# Patient Record
Sex: Male | Born: 1985 | Race: White | Hispanic: No | Marital: Single | Smoking: Never smoker
Health system: Southern US, Community
[De-identification: ages and names within clinical notes are randomized; demographics above are authoritative.]

## PROBLEM LIST (undated history)

## (undated) DIAGNOSIS — C8590 Non-Hodgkin lymphoma, unspecified, unspecified site: Secondary | ICD-10-CM

## (undated) DIAGNOSIS — R569 Unspecified convulsions: Secondary | ICD-10-CM

## (undated) HISTORY — PX: WISDOM TOOTH EXTRACTION: SHX21

---
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
## 2017-03-02 ENCOUNTER — Emergency Department: Payer: Managed Care, Other (non HMO)

## 2017-03-02 ENCOUNTER — Encounter: Payer: Self-pay | Admitting: Emergency Medicine

## 2017-03-02 ENCOUNTER — Emergency Department
Admission: EM | Admit: 2017-03-02 | Discharge: 2017-03-02 | Disposition: A | Discharge status: Home / Self Care | Payer: Managed Care, Other (non HMO) | Attending: Emergency Medicine | Admitting: Emergency Medicine

## 2017-03-02 ENCOUNTER — Other Ambulatory Visit: Payer: Self-pay

## 2017-03-02 DIAGNOSIS — R569 Unspecified convulsions: Secondary | ICD-10-CM | POA: Diagnosis not present

## 2017-03-02 HISTORY — DX: Non-Hodgkin lymphoma, unspecified, unspecified site: C85.90

## 2017-03-02 HISTORY — DX: Unspecified convulsions: R56.9

## 2017-03-02 LAB — COMPREHENSIVE METABOLIC PANEL
ALBUMIN: 3.9 g/dL (ref 3.5–5.0)
ALK PHOS: 49 U/L (ref 38–126)
ALT: 18 U/L (ref 17–63)
AST: 33 U/L (ref 15–41)
Anion gap: 12 (ref 5–15)
BILIRUBIN TOTAL: 0.8 mg/dL (ref 0.3–1.2)
BUN: 18 mg/dL (ref 6–20)
CO2: 20 mmol/L — ABNORMAL LOW (ref 22–32)
CREATININE: 1.37 mg/dL — AB (ref 0.61–1.24)
Calcium: 9 mg/dL (ref 8.9–10.3)
Chloride: 107 mmol/L (ref 101–111)
GFR calc Af Amer: >60 mL/min (ref >60)
GLUCOSE: 143 mg/dL — AB (ref 65–99)
POTASSIUM: 3.8 mmol/L (ref 3.5–5.1)
Sodium: 139 mmol/L (ref 135–145)
TOTAL PROTEIN: 7.3 g/dL (ref 6.5–8.1)

## 2017-03-02 LAB — CBC WITH DIFFERENTIAL/PLATELET
BASOS ABS: 0.1 10*3/uL (ref 0–0.1)
BASOS PCT: 1 %
Eosinophils Absolute: 0.2 10*3/uL (ref 0–0.7)
Eosinophils Relative: 3 %
HEMATOCRIT: 44.7 % (ref 40.0–52.0)
HEMOGLOBIN: 15 g/dL (ref 13.0–18.0)
LYMPHS PCT: 34 %
Lymphs Abs: 2.1 10*3/uL (ref 1.0–3.6)
MCH: 30.8 pg (ref 26.0–34.0)
MCHC: 33.5 g/dL (ref 32.0–36.0)
MCV: 92 fL (ref 80.0–100.0)
MONO ABS: 0.6 10*3/uL (ref 0.2–1.0)
Monocytes Relative: 10 %
NEUTROS ABS: 3.3 10*3/uL (ref 1.4–6.5)
NEUTROS PCT: 52 %
Platelets: 259 10*3/uL (ref 150–440)
RBC: 4.86 MIL/uL (ref 4.40–5.90)
RDW: 12.5 % (ref 11.5–14.5)
WBC: 6.3 10*3/uL (ref 3.8–10.6)

## 2017-03-02 MED ORDER — LEVETIRACETAM 500 MG PO TABS: 500.0000 mg | ORAL_TABLET | Freq: Two times a day (BID) | ORAL | 0 refills | Status: DC

## 2017-03-02 MED ORDER — SODIUM CHLORIDE 0.9 % IV BOLUS (SEPSIS)
1000.0000 mL | Freq: Once | INTRAVENOUS | Status: AC
  Administered 2017-03-02: 1000 mL via INTRAVENOUS

## 2017-03-02 MED ORDER — SODIUM CHLORIDE 0.9 % IV SOLN
1000.0000 mg | Freq: Once | INTRAVENOUS | Status: AC
  Administered 2017-03-02: 1000 mg via INTRAVENOUS
  Filled 2017-03-02: qty 10

## 2017-03-02 NOTE — ED Provider Notes (Signed)
[facility] Emergency Department Provider Note  ____________________________________________   None    (approximate)  I have reviewed the triage vital signs and the nursing notes.   HISTORY  Chief Complaint Seizures   HPI Carl Walter is a 32 y.o. male with a history of seizures x2 as well as non-Hodgkin's lymphoma who is presenting to the emergency department today after seizure.  The seizure was witnessed by bystanders.  Per EMS, there was generalized tonic-clonic activity that lasted about 5 minutes.  Patient says that he could "feel the seizure coming on."  He says that he felt slightly lightheaded prior to procedure beginning.  EMS was called.  Patient was confused/postictal but denies any complaints at this time.  He says that he did not sleep well last night but otherwise he does not drink excessively.  Says that he drinks about once a week.  Does not use drugs.  No seizure history in his family.  Says that he thinks he has had a CAT scan before with his previous seizures but is unsure about this.  Has not come to this hospital for her medical issues.  Says that he was not started on any seizure medications previously because he said the doctors "did not find any signs that he would have an additional seizure."  Per EMS, the patient was lowered to the ground by bystanders and did not fall or hit his head.   Past Medical History:  Diagnosis Date  . Non Hodgkin's lymphoma ([place]) 1998  . Seizures ([place])     There are no active problems to display for this patient.   Past Surgical History:  Procedure Laterality Date  . EYE SURGERY Left 1998  . WISDOM TOOTH EXTRACTION      Prior to Admission medications   Not on File    Allergies Patient has no known allergies.  History reviewed. No pertinent family history.  Social History Social History   Tobacco Use  . Smoking status: Never Smoker  . Smokeless tobacco: Never Used  Substance Use Topics  .  Alcohol use: Yes    Comment: once/week  . Drug use: No    Review of Systems  Constitutional: No fever/chills Eyes: No visual changes. ENT: No sore throat. Cardiovascular: Denies chest pain. Respiratory: Denies shortness of breath. Gastrointestinal: No abdominal pain.  No nausea, no vomiting.  No diarrhea.  No constipation. Genitourinary: Negative for dysuria. Musculoskeletal: Negative for back pain. Skin: Negative for rash. Neurological: Negative for headaches, focal weakness or numbness.   ____________________________________________   PHYSICAL EXAM:  VITAL SIGNS: ED Triage Vitals  Enc Vitals Group     BP 03/02/17 0924 121/75     Pulse Rate 03/02/17 0924 (!) 113     Resp 03/02/17 0924 16     Temp 03/02/17 0924 98.2 F (36.8 C)     Temp Source 03/02/17 0924 Oral     SpO2 03/02/17 0924 91 %     Weight 03/02/17 0927 165 lb (74.8 kg)     Height 03/02/17 0927 5\' 10"  (1.778 m)     Head Circumference --      Peak Flow --      Pain Score --      Pain Loc --      Pain Edu? --      Excl. in [place]? --     Constitutional: Alert and oriented. Well appearing and in no acute distress. Eyes: Conjunctivae are normal.  Head: 2 x 4 cm  hematoma over the right superior aspect of the forehead.  No bogginess.  Minimal tenderness to palpation. Nose: No congestion/rhinnorhea. Mouth/Throat: Mucous membranes are moist.  No tongue bite visualized. Neck: No stridor.   Cardiovascular: Tachycardic, regular rhythm. Grossly normal heart sounds.   Respiratory: Normal respiratory effort.  No retractions. Lungs CTAB. Gastrointestinal: Soft and nontender. No distention. Musculoskeletal: No lower extremity tenderness nor edema.  No joint effusions. Neurologic:  Normal speech and language. No gross focal neurologic deficits are appreciated. Skin:  Skin is warm, dry and intact. No rash noted. Psychiatric: Mood and affect are normal. Speech and behavior are  normal.  ____________________________________________   LABS (all labs ordered are listed, but only abnormal results are displayed)  Labs Reviewed  COMPREHENSIVE METABOLIC PANEL - Abnormal; Notable for the following components:      Result Value   CO2 20 (*)    Glucose, Bld 143 (*)    Creatinine, Ser 1.37 (*)    All other components within normal limits  CBC WITH DIFFERENTIAL/PLATELET   ____________________________________________  EKG  ED ECG REPORT I, Doran Stabler, the attending physician, personally viewed and interpreted this ECG.   Date: 03/02/2017  EKG Time: 0925  Rate: 114  Rhythm: sinus tachycardia  Axis: Normal  Intervals:none  ST&T Change: No ST segment elevation or depression.  No abnormal T wave inversion.  ____________________________________________  RADIOLOGY  Scalp swelling but without any other pathology on the CAT scan. ____________________________________________   PROCEDURES  Procedure(s) performed:   Procedures  Critical Care performed:   ____________________________________________   INITIAL IMPRESSION / ASSESSMENT AND PLAN / ED COURSE  Pertinent labs & imaging results that were available during my care of the patient were reviewed by me and considered in my medical decision making (see chart for details).  DDX: Electrolyte abnormality, brain hemorrhage, intracranial mass epilepsy, fatigue No previous records on file for review.  Patient states that he still drives.  Patient knows no driving is further cleared by neurology.  He is understanding of this plan.    ----------------------------------------- 10:13 AM on 03/02/2017 -----------------------------------------  Patient vomited x1 but denies any nausea or vomiting.  Heart rate of 90 bpm at this time.  Blood pressure 121/61.  We discussed starting Keppra as well as need to follow-up with neurology.  Also understands that he must not drive until cleared by neurologist to  resume.  This is the patient's third seizure.  I believe that he will need to be started on medication at this time and he will be discharged with twice daily Keppra 500 mg.  We also discussed the need for further workup with neurology including EEG as well as MRI.  The patient is understanding of this plan willing to comply.  Although the patient became lightheaded prior to the event happening, it is less likely that this was a syncopal episode as a generalized tonic-clonic activity was observed for 5 minutes.  Patient also confused afterwards likely representing postictal state.  However, he is fully awake and alert and oriented at this time and without any complaints.  ____________________________________________   FINAL CLINICAL IMPRESSION(S) / ED DIAGNOSES  Seizure.    NEW MEDICATIONS STARTED DURING THIS VISIT:  New Prescriptions   No medications on file     Note:  This document was prepared using Dragon voice recognition software and may include unintentional dictation errors.     Orbie Pyo, MD 03/02/17 [phone]

## 2017-03-02 NOTE — ED Notes (Signed)
Pt in NAD at time of d/c, VS stable. Pt ambulatory. Pt verbalizes d/c understanding and follow up

## 2017-03-02 NOTE — ED Triage Notes (Signed)
Pt to ED via EMS from work c/o grand mal seizure today, was helped to floor when coworkers noticed patient starting to shake.  Lasted 5-10 minutes, post-ictal for EMS.  Last seizure 1 year ago, denies medications for seizures.  EMS vitals CBG 150, 98.7 temp  Pt presents A&Ox4, speaking in complete and coherent sentences, pale and diaphoretic, chest rise even and unlabored.

## 2018-10-22 ENCOUNTER — Emergency Department: Payer: Managed Care, Other (non HMO)

## 2018-10-22 ENCOUNTER — Emergency Department
Admission: EM | Admit: 2018-10-22 | Discharge: 2018-10-22 | Disposition: A | Discharge status: Home / Self Care | Payer: Managed Care, Other (non HMO) | Attending: Emergency Medicine | Admitting: Emergency Medicine

## 2018-10-22 ENCOUNTER — Encounter: Payer: Self-pay | Admitting: Emergency Medicine

## 2018-10-22 DIAGNOSIS — Y990 Civilian activity done for income or pay: Secondary | ICD-10-CM | POA: Insufficient documentation

## 2018-10-22 DIAGNOSIS — R55 Syncope and collapse: Secondary | ICD-10-CM | POA: Insufficient documentation

## 2018-10-22 DIAGNOSIS — Y939 Activity, unspecified: Secondary | ICD-10-CM | POA: Diagnosis not present

## 2018-10-22 DIAGNOSIS — S43006A Unspecified dislocation of unspecified shoulder joint, initial encounter: Secondary | ICD-10-CM

## 2018-10-22 DIAGNOSIS — S43014A Anterior dislocation of right humerus, initial encounter: Secondary | ICD-10-CM | POA: Insufficient documentation

## 2018-10-22 DIAGNOSIS — W010XXA Fall on same level from slipping, tripping and stumbling without subsequent striking against object, initial encounter: Secondary | ICD-10-CM | POA: Diagnosis not present

## 2018-10-22 DIAGNOSIS — Y9259 Other trade areas as the place of occurrence of the external cause: Secondary | ICD-10-CM | POA: Diagnosis not present

## 2018-10-22 DIAGNOSIS — S4991XA Unspecified injury of right shoulder and upper arm, initial encounter: Secondary | ICD-10-CM | POA: Diagnosis present

## 2018-10-22 LAB — CBC WITH DIFFERENTIAL/PLATELET
Abs Immature Granulocytes: 0.05 10*3/uL (ref 0.00–0.07)
Basophils Absolute: 0.1 10*3/uL (ref 0.0–0.1)
Basophils Relative: 1 %
Eosinophils Absolute: 0.1 10*3/uL (ref 0.0–0.5)
Eosinophils Relative: 2 %
HCT: 44.9 % (ref 39.0–52.0)
Hemoglobin: 14.8 g/dL (ref 13.0–17.0)
Immature Granulocytes: 1 %
Lymphocytes Relative: 34 %
Lymphs Abs: 2.3 10*3/uL (ref 0.7–4.0)
MCH: 30.7 pg (ref 26.0–34.0)
MCHC: 33 g/dL (ref 30.0–36.0)
MCV: 93.2 fL (ref 80.0–100.0)
Monocytes Absolute: 0.5 10*3/uL (ref 0.1–1.0)
Monocytes Relative: 7 %
Neutro Abs: 3.8 10*3/uL (ref 1.7–7.7)
Neutrophils Relative %: 55 %
Platelets: 275 10*3/uL (ref 150–400)
RBC: 4.82 MIL/uL (ref 4.22–5.81)
RDW: 12.4 % (ref 11.5–15.5)
WBC: 6.7 10*3/uL (ref 4.0–10.5)
nRBC: 0 % (ref 0.0–0.2)

## 2018-10-22 LAB — BASIC METABOLIC PANEL
Anion gap: 16 — ABNORMAL HIGH (ref 5–15)
BUN: 17 mg/dL (ref 6–20)
CO2: 18 mmol/L — ABNORMAL LOW (ref 22–32)
Calcium: 9.1 mg/dL (ref 8.9–10.3)
Chloride: 107 mmol/L (ref 98–111)
Creatinine, Ser: 1.42 mg/dL — ABNORMAL HIGH (ref 0.61–1.24)
GFR calc Af Amer: >60 mL/min (ref >60)
GFR calc non Af Amer: >60 mL/min (ref >60)
Glucose, Bld: 165 mg/dL — ABNORMAL HIGH (ref 70–99)
Potassium: 3.9 mmol/L (ref 3.5–5.1)
Sodium: 141 mmol/L (ref 135–145)

## 2018-10-22 MED ORDER — MIDAZOLAM HCL 2 MG/2ML IJ SOLN
4.0000 mg | Freq: Once | INTRAMUSCULAR | Status: AC
  Administered 2018-10-22: 13:00:00 4 mg via INTRAVENOUS
  Filled 2018-10-22: qty 4

## 2018-10-22 MED ORDER — PROPOFOL 10 MG/ML IV BOLUS
INTRAVENOUS | Status: AC | PRN
  Administered 2018-10-22 (×3): 8 mg via INTRAVENOUS
  Administered 2018-10-22: 4 mg via INTRAVENOUS

## 2018-10-22 MED ORDER — ETOMIDATE 2 MG/ML IV SOLN
INTRAVENOUS | Status: AC | PRN
  Administered 2018-10-22 (×2): 10 mg via INTRAVENOUS

## 2018-10-22 MED ORDER — PROPOFOL 10 MG/ML IV BOLUS
1.0000 mg/kg | Freq: Once | INTRAVENOUS | Status: DC
  Filled 2018-10-22: qty 20

## 2018-10-22 MED ORDER — ETOMIDATE 2 MG/ML IV SOLN
20.0000 mg | Freq: Once | INTRAVENOUS | Status: DC
  Filled 2018-10-22: qty 10

## 2018-10-22 MED ORDER — HYDROMORPHONE HCL 1 MG/ML IJ SOLN
1.0000 mg | Freq: Once | INTRAMUSCULAR | Status: AC
  Administered 2018-10-22: 10:00:00 1 mg via INTRAVENOUS
  Filled 2018-10-22: qty 1

## 2018-10-22 MED ORDER — SODIUM CHLORIDE 0.9 % IV BOLUS
1000.0000 mL | Freq: Once | INTRAVENOUS | Status: AC
  Administered 2018-10-22: 10:00:00 1000 mL via INTRAVENOUS

## 2018-10-22 MED ORDER — KETAMINE HCL 10 MG/ML IJ SOLN
1.0000 mg/kg | Freq: Once | INTRAMUSCULAR | Status: DC
  Filled 2018-10-22: qty 1

## 2018-10-22 MED ORDER — SODIUM CHLORIDE 0.9 % IV BOLUS
1000.0000 mL | Freq: Once | INTRAVENOUS | Status: AC
  Administered 2018-10-22: 14:00:00 1000 mL via INTRAVENOUS

## 2018-10-22 MED ORDER — LEVETIRACETAM IN NACL 1000 MG/100ML IV SOLN
1000.0000 mg | Freq: Once | INTRAVENOUS | Status: AC
  Administered 2018-10-22: 11:00:00 1000 mg via INTRAVENOUS
  Filled 2018-10-22: qty 100

## 2018-10-22 MED ORDER — ONDANSETRON HCL 4 MG/2ML IJ SOLN
4.0000 mg | Freq: Once | INTRAMUSCULAR | Status: AC
  Administered 2018-10-22: 12:00:00 4 mg via INTRAVENOUS
  Filled 2018-10-22: qty 2

## 2018-10-22 MED ORDER — PROPOFOL 10 MG/ML IV BOLUS
INTRAVENOUS | Status: AC | PRN
  Administered 2018-10-22: 8 mg via INTRAVENOUS

## 2018-10-22 MED ORDER — KETOROLAC TROMETHAMINE 10 MG PO TABS: 10.0000 mg | ORAL_TABLET | Freq: Four times a day (QID) | ORAL | 0 refills | Status: DC | PRN

## 2018-10-22 MED ORDER — PROPOFOL 10 MG/ML IV BOLUS
INTRAVENOUS | Status: AC
  Filled 2018-10-22: qty 20

## 2018-10-22 MED ORDER — LEVETIRACETAM 500 MG PO TABS: 500.0000 mg | ORAL_TABLET | Freq: Two times a day (BID) | ORAL | 0 refills | Status: DC

## 2018-10-22 NOTE — ED Notes (Signed)
Pt verbalized understanding of discharge instructions. Pt A&O and took PO challenge without difficulty. Pt wheeled to car by this RN and pt is NAD at this time.

## 2018-10-22 NOTE — Discharge Instructions (Addendum)
Keep the shoulder immobilizer in place at all times except bathing for the next week.  When you take the immobilizer off, keep the right arm hanging by your side. Do not lift your right arm, especially up to chest level or higher, until seen by orthopedics.

## 2018-10-22 NOTE — ED Notes (Signed)
Pt resting comfortably with mother at bedside. Pt's vitals WDL and currently on 2L O2 for comfort. Will continue to monitor.

## 2018-10-22 NOTE — Consult Note (Signed)
ORTHOPAEDIC CONSULTATION  PATIENT NAME: Carl Walter DOB: 12/09/1985  MRN: [id number]  REQUESTING PHYSICIAN: Carrie Mew, MD  Chief Complaint: Right shoulder anterior dislocation  HPI: Carl Walter is a 33 y.o. male who complains of right shoulder pain after a syncopal episode and fall.  He was brought to the ER at [facility] where x-rays showed evidence of a right shoulder anterior dislocation.  Patient has prior history of another dislocation a year ago that was treated with closed reduction.  Patient works at [employer].  He denies pain anywhere else in his body.  Past Medical History:  Diagnosis Date  . Non Hodgkin's lymphoma ([place]) 1998  . Seizures ([place])    Past Surgical History:  Procedure Laterality Date  . EYE SURGERY Left 1998  . WISDOM TOOTH EXTRACTION     Social History   Socioeconomic History  . Marital status: Single    Spouse name: Not on file  . Number of children: Not on file  . Years of education: Not on file  . Highest education level: Not on file  Occupational History  . Not on file  Social Needs  . Financial resource strain: Not on file  . Food insecurity    Worry: Not on file    Inability: Not on file  . Transportation needs    Medical: Not on file    Non-medical: Not on file  Tobacco Use  . Smoking status: Never Smoker  . Smokeless tobacco: Never Used  Substance and Sexual Activity  . Alcohol use: Yes    Comment: once/week  . Drug use: No  . Sexual activity: Not on file  Lifestyle  . Physical activity    Days per week: Not on file    Minutes per session: Not on file  . Stress: Not on file  Relationships  . Social Herbalist on phone: Not on file    Gets together: Not on file    Attends religious service: Not on file    Active member of club or organization: Not on file    Attends meetings of clubs or organizations: Not on file    Relationship status: Not on file  Other Topics Concern  . Not on file   Social History Narrative  . Not on file   History reviewed. No pertinent family history. No Known Allergies Prior to Admission medications   Medication Sig Start Date End Date Taking? Authorizing Provider  montelukast (SINGULAIR) 10 MG tablet Take 10 mg by mouth daily as needed (allergy symptom).   Yes [provider]  levETIRAcetam (KEPPRA) 500 MG tablet Take 1 tablet (500 mg total) by mouth 2 (two) times daily. Patient not taking: Reported on 10/22/2018 03/02/17   Orbie Pyo, MD   Dg Shoulder Right  Result Date: 10/22/2018 CLINICAL DATA:  Attempted reduction EXAM: RIGHT SHOULDER - 2+ VIEW COMPARISON:  10/22/2018, 10:39 a.m. FINDINGS: The right glenohumeral joint remains anteriorly dislocated. No obvious fracture. The right acromioclavicular joint is preserved. The partially imaged right chest is unremarkable. IMPRESSION: The right glenohumeral joint remains anteriorly dislocated. No obvious fracture. Electronically Signed   By: Eddie Candle M.D.   On: 10/22/2018 12:52   Dg Shoulder Right  Result Date: 10/22/2018 CLINICAL DATA:  Right shoulder pain EXAM: RIGHT SHOULDER - 2+ VIEW COMPARISON:  None. FINDINGS: There is anterior dislocation of the right glenohumeral joint. No obvious humeral or bony glenoid fracture. The right acromioclavicular joint is preserved. The partially imaged right chest  is unremarkable. IMPRESSION: There is anterior dislocation of the right glenohumeral joint. No obvious humeral or bony glenoid fracture. Electronically Signed   By: Eddie Candle M.D.   On: 10/22/2018 11:22    Positive ROS: All other systems have been reviewed and were otherwise negative with the exception of those mentioned in the HPI and as above.  Physical Exam: General: Well developed, well nourished male seen in no acute distress. HEENT: Atraumatic and normocephalic. Sclera are clear. Extraocular motion is intact. Oropharynx is clear with moist mucosa. Neck: Supple,  nontender, good range of motion. No JVD or carotid bruits. Lungs: Clear to auscultation bilaterally. Cardiovascular: Regular rate and rhythm with normal S1 and S2. No murmurs. No gallops or rubs. Pedal pulses are palpable bilaterally. Homans test is negative bilaterally. No significant pretibial or ankle edema. Abdomen: Soft, nontender, and nondistended. Bowel sounds are present. Skin: No lesions in the area of chief complaint Neurologic: Awake, alert, and oriented. Sensory function is grossly intact. Motor strength is felt to be 5 over 5 bilaterally. No clonus or tremor. Good motor coordination. Lymphatic: No axillary or cervical lymphadenopathy  MUSCULOSKELETAL: Right upper extremity is held in slight external rotation.  There is empty glenoid sign palpable.  Right shoulder range of motion is painful.  He has intact function of radial median and ulnar nerve and sensory and motor distribution in his right upper extremity.  Brisk capillary refill is present.  Forearm compartments are soft.  Sensations are also intact in axillary nerve distribution.  Assessment: 32 years old male with acute right shoulder recurrent anterior dislocation  Plan: 33 years old male with acute right shoulder recurrent anterior dislocation.  Patient underwent close reduction in the ER under sedation.  Postreduction x-rays confirmed adequate reduction.  Patient was placed in a shoulder sling.  Patient will follow-up with Dr. Harlow Mares here at [facility] in a week.  Creig Hines, M.D.

## 2018-10-22 NOTE — ED Triage Notes (Signed)
Pt to ED by EMS after having seizure while at work. Pt also fell has 2 hematomas to forehead, 2 [place] to back of head and c/o of right shoulder pain. Pt was post ictal upon EMS arrival.

## 2018-10-22 NOTE — ED Notes (Signed)
Pre Procedure checklist took place prior to procedure at 12:00. Error in charting.

## 2018-10-22 NOTE — ED Notes (Signed)
Patient transported to X-ray 

## 2018-10-22 NOTE — ED Provider Notes (Signed)
[facility] Emergency Department Provider Note  ____________________________________________  Time seen: Approximately 11:47 AM  I have reviewed the triage vital signs and the nursing notes.   HISTORY  Chief Complaint Loss of consciousness   HPI Carl Walter is a 33 y.o. male with a history of non-Hodgkin's lymphoma who is brought to the ED after a loss of consciousness while at work.  Fell from standing onto his right side, EMS noted patient to be confused on their arrival, patient reports regaining alertness while being transported by EMS.  Prior to event, reports feeling lightheaded but no other symptoms.  No headache vision changes paresthesias weakness palpitations chest pain shortness of breath.  He has had episodes like this in the past that are attributed to dehydration, poor diet, poor sleep.  He notes that lately he has not been getting good sleep, and he has not had anything to eat or drink today so far.  Last tetanus shot was 4 years ago.  Complains of pain in the right shoulder.  Denies headache.   He had previously been evaluated for possible seizure by neurology, had multiple EEGs, all unremarkable so neurology discontinued him from [place] and told him that this was syncope and not seizure.   Past Medical History:  Diagnosis Date  . Non Hodgkin's lymphoma ([place]) 1998  . Seizures ([place])      There are no active problems to display for this patient.    Past Surgical History:  Procedure Laterality Date  . EYE SURGERY Left 1998  . WISDOM TOOTH EXTRACTION       Prior to Admission medications   Medication Sig Start Date End Date Taking? Authorizing Provider  montelukast (SINGULAIR) 10 MG tablet Take 10 mg by mouth daily as needed (allergy symptom).   Yes [provider]  levETIRAcetam (KEPPRA) 500 MG tablet Take 1 tablet (500 mg total) by mouth 2 (two) times daily. Patient not taking: Reported on 10/22/2018 03/02/17   Schaevitz, Randall An, MD     Allergies Patient has no known allergies.   History reviewed. No pertinent family history.  Social History Social History   Tobacco Use  . Smoking status: Never Smoker  . Smokeless tobacco: Never Used  Substance Use Topics  . Alcohol use: Yes    Comment: once/week  . Drug use: No    Review of Systems  Constitutional:   No fever or chills.  ENT:   No sore throat. No rhinorrhea. Cardiovascular:   No chest pain or syncope. Respiratory:   No dyspnea or cough. Gastrointestinal:   Negative for abdominal pain, vomiting and diarrhea.  Musculoskeletal:   Right shoulder pain as above All other systems reviewed and are negative except as documented above in ROS and HPI.  ____________________________________________   PHYSICAL EXAM:  VITAL SIGNS: ED Triage Vitals  Enc Vitals Group     BP 10/22/18 0944 (!) 151/83     Pulse Rate 10/22/18 0944 (!) 106     Resp 10/22/18 1030 15     Temp 10/22/18 0944 98.1 F (36.7 C)     Temp Source 10/22/18 0944 Oral     SpO2 10/22/18 0941 95 %     Weight 10/22/18 0945 160 lb (72.6 kg)     Height 10/22/18 0945 5\' 10"  (1.778 m)     Head Circumference --      Peak Flow --      Pain Score 10/22/18 0945 8     Pain Loc --  Pain Edu? --      Excl. in [place]? --     Vital signs reviewed, nursing assessments reviewed.   Constitutional:   Alert and oriented. Non-toxic appearance. Eyes:   Conjunctivae are normal. EOMI. PERRL.  No raccoon eyes ENT      Head:   Normocephalic with abrasion and contusion of the upper forehead bilaterally.  Small abrasion over the top of the head as well.  No lacerations.  No battle sign, no otorrhea      Nose:   No congestion/rhinnorhea.  No epistaxis no septal hematoma, no rhinorrhea      Mouth/Throat:   MMM, no pharyngeal erythema. No peritonsillar mass.  No intraoral injury.      Neck:   No meningismus. Full ROM.  No midline spinal tenderness Hematological/Lymphatic/Immunilogical:   No  cervical lymphadenopathy. Cardiovascular:   RRR. Symmetric bilateral radial and DP pulses.  No murmurs. Cap refill less than 2 seconds. Respiratory:   Normal respiratory effort without tachypnea/retractions. Breath sounds are clear and equal bilaterally. No wheezes/rales/rhonchi. Gastrointestinal:   Soft and nontender. Non distended. There is no CVA tenderness.  No rebound, rigidity, or guarding.  Musculoskeletal: Limited range of motion of the right shoulder with pain.  Emptiness over the lateral deltoid.Marland Kitchen Neurologic:   Normal speech and language.  Motor grossly intact. No acute focal neurologic deficits are appreciated.  Skin:    Skin is warm, dry and intact. No rash noted.  No petechiae, purpura, or bullae.  ____________________________________________    LABS (pertinent positives/negatives) (all labs ordered are listed, but only abnormal results are displayed) Labs Reviewed  BASIC METABOLIC PANEL - Abnormal; Notable for the following components:      Result Value   CO2 18 (*)    Glucose, Bld 165 (*)    Creatinine, Ser 1.42 (*)    Anion gap 16 (*)    All other components within normal limits  CBC WITH DIFFERENTIAL/PLATELET   ____________________________________________   EKG    ____________________________________________    RADIOLOGY  Dg Shoulder Right  Result Date: 10/22/2018 CLINICAL DATA:  Right shoulder pain EXAM: RIGHT SHOULDER - 2+ VIEW COMPARISON:  None. FINDINGS: There is anterior dislocation of the right glenohumeral joint. No obvious humeral or bony glenoid fracture. The right acromioclavicular joint is preserved. The partially imaged right chest is unremarkable. IMPRESSION: There is anterior dislocation of the right glenohumeral joint. No obvious humeral or bony glenoid fracture. Electronically Signed   By: Eddie Candle M.D.   On: 10/22/2018 11:22    ____________________________________________   PROCEDURES .Sedation  Date/Time: 10/22/2018 12:44  PM Performed by: Carrie Mew, MD Authorized by: Carrie Mew, MD   Consent:    Consent obtained:  Written (electronic informed consent)   Consent given by:  Patient   Risks discussed:  Allergic reaction, dysrhythmia, inadequate sedation, nausea, vomiting, respiratory compromise necessitating ventilatory assistance and intubation, prolonged sedation necessitating reversal and prolonged hypoxia resulting in organ damage   Alternatives discussed:  Analgesia without sedation Universal protocol:    Procedure explained and questions answered to patient or proxy's satisfaction: yes     Relevant documents present and verified: yes     Test results available and properly labeled: yes     Imaging studies available: yes     Required blood products, implants, devices, and special equipment available: yes     Immediately prior to procedure a time out was called: yes     Patient identity confirmation method:  Arm band Indications:  Procedure performed:  Dislocation reduction   Procedure necessitating sedation performed by:  Physician performing sedation Pre-sedation assessment:    Time since last food or drink:  >12 hours   ASA classification: class 1 - normal, healthy patient     Neck mobility: normal     Mouth opening:  3 or more finger widths   Thyromental distance:  4 finger widths   Mallampati score:  I - soft palate, uvula, fauces, pillars visible   Pre-sedation assessments completed and reviewed: airway patency, cardiovascular function, hydration status, mental status, nausea/vomiting, pain level and respiratory function     Pre-sedation assessment completed:  10/22/2018 11:30 AM Immediate pre-procedure details:    Reassessment: Patient reassessed immediately prior to procedure     Reviewed: vital signs, relevant labs/tests and NPO status     Verified: bag valve mask available, emergency equipment available, intubation equipment available, IV patency confirmed, oxygen available,  reversal medications available and suction available   Procedure details (see MAR for exact dosages):    Preoxygenation:  Nasal cannula   Sedation:  Etomidate and propofol   Analgesia:  Hydromorphone   Intra-procedure monitoring:  Blood pressure monitoring, continuous pulse oximetry, cardiac monitor, frequent vital sign checks, frequent LOC assessments and continuous capnometry   Intra-procedure events: emesis     Intra-procedure management:  Airway suctioning   Total Provider sedation time (minutes):  45 Post-procedure details:    Post-sedation assessment completed:  10/22/2018 12:45 PM   Attendance: Constant attendance by certified staff until patient recovered     Recovery: Patient returned to pre-procedure baseline     Post-sedation assessments completed and reviewed: airway patency, cardiovascular function, hydration status, mental status, nausea/vomiting, pain level and respiratory function     Patient is stable for discharge or admission: yes     Patient tolerance:  Tolerated with difficulty Comments:     Patient given initial 20 mg of etomidate IV.  Patient had fasciculations which caused vomiting.  Patient was repositioned, mouth suctioned.  He was somnolent after fasciculations and vomiting ended, so reduction was attempted but unsuccessful.  Discussed with his mother who is at bedside regarding possibility of terminating sedation at this time versus trying propofol, she thought it best on his behalf to proceed with propofol.  Patient was given escalating doses of propofol for a total of 200 mg, only achieving moderate sedation.  Further attempts were made during this time to reduce the shoulder but unsuccessful.     ____________________________________________  DIFFERENTIAL DIAGNOSIS   Dehydration, syncope, seizure, shoulder dislocation  CLINICAL IMPRESSION / ASSESSMENT AND PLAN / ED COURSE  Medications ordered in the ED: Medications  etomidate (AMIDATE) injection 20 mg (has  no administration in time range)  propofol (DIPRIVAN) 10 mg/mL bolus/IV push (has no administration in time range)  levETIRAcetam (KEPPRA) IVPB 1000 mg/100 mL premix (0 mg Intravenous Stopped 10/22/18 1154)  HYDROmorphone (DILAUDID) injection 1 mg (1 mg Intravenous Given 10/22/18 1009)  sodium chloride 0.9 % bolus 1,000 mL (1,000 mLs Intravenous New Bag/Given 10/22/18 1014)  ondansetron (ZOFRAN) injection 4 mg (4 mg Intravenous Given 10/22/18 1155)    Pertinent labs & imaging results that were available during my care of the patient were reviewed by me and considered in my medical decision making (see chart for details).  Carl Walter was evaluated in Emergency Department on 10/22/2018 for the symptoms described in the history of present illness. He was evaluated in the context of the global COVID-19 pandemic, which necessitated consideration that the patient  might be at risk for infection with the SARS-CoV-2 virus that causes COVID-19. Institutional protocols and algorithms that pertain to the evaluation of patients at risk for COVID-19 are in a state of rapid change based on information released by regulatory bodies including the CDC and federal and state organizations. These policies and algorithms were followed during the patient's care in the ED.   Patient presents after loss of consciousness most likely syncope given his prior neurologic work-up and being n.p.o. since yesterday unintentionally.  Clinically right shoulder appears dislocated, will get x-ray.  Clinically there is low suspicion for intracranial hemorrhage or C-spine fracture or other acute neuraxial hemorrhage, so CT imaging or x-rays not indicated at this time.  Clinical Course as of Oct 22 1515  Sat Oct 22, 2018  1100 Shoulder x-ray shows an anterior dislocation of the right shoulder.  We will plan for deep sedation for reduction.   [PS]  1235 Sedation and reduction unsuccessful despite 20mg  etomidate and 200mg  propofol. Unable to  achieve adequate sedation. Will d/w ortho.    [PS]  1237 D/w Dr. Donivan Scull of ortho, will come to ED to eval.    [PS]  1332 Pt given additional propofol and versed with patient's confirmed ongoing consent for further attempt. Dr. Donivan Scull with myself and another assistant  performed reduction sucessfully. Follow xray confirms successful reduction.   [PS]    Clinical Course User Index [PS] Carrie Mew, MD     ----------------------------------------- 3:17 PM on 10/22/2018 -----------------------------------------  Awake alert, back to baseline.  Stable for discharge home with mom.  ____________________________________________   FINAL CLINICAL IMPRESSION(S) / ED DIAGNOSES    Final diagnoses:  Syncope, unspecified syncope type  Anterior dislocation of right shoulder, initial encounter     ED Discharge Orders    None      Portions of this note were generated with dragon dictation software. Dictation errors may occur despite best attempts at proofreading.   Carrie Mew, MD 10/22/18 1517

## 2018-11-25 ENCOUNTER — Encounter: Payer: Self-pay | Admitting: Emergency Medicine

## 2018-11-25 ENCOUNTER — Emergency Department
Admission: EM | Admit: 2018-11-25 | Discharge: 2018-11-25 | Disposition: A | Discharge status: Home / Self Care | Payer: Managed Care, Other (non HMO) | Attending: Emergency Medicine | Admitting: Emergency Medicine

## 2018-11-25 ENCOUNTER — Other Ambulatory Visit: Payer: Self-pay

## 2018-11-25 DIAGNOSIS — Z8572 Personal history of non-Hodgkin lymphomas: Secondary | ICD-10-CM | POA: Insufficient documentation

## 2018-11-25 DIAGNOSIS — R569 Unspecified convulsions: Secondary | ICD-10-CM | POA: Diagnosis present

## 2018-11-25 DIAGNOSIS — Z79899 Other long term (current) drug therapy: Secondary | ICD-10-CM | POA: Insufficient documentation

## 2018-11-25 MED ORDER — LEVETIRACETAM 500 MG PO TABS: 500.0000 mg | ORAL_TABLET | Freq: Two times a day (BID) | ORAL | 1 refills | Status: DC

## 2018-11-25 NOTE — ED Provider Notes (Signed)
[facility] Emergency Department Provider Note   ____________________________________________    I have reviewed the triage vital signs and the nursing notes.   HISTORY  Chief Complaint Seizures     HPI Carl Walter is a 33 y.o. male with a history of non-Hodgkin's lymphoma, seizures who presents after reported seizure episode.  Patient describes that he has been worked up by neurology for this and I have confirmed this via the medical records.  Dr. Melrose Nakayama believes that he is having syncopal episodes with seizure-like activity afterwards, had normal EEG.  Was on Keppra but ran out.  Actually has an appointment with Dr. Melrose Nakayama in 3 days for reevaluation.  States he was sitting in a chair when this happened.  No loss of bowel or bladder continence.  No tongue injury.  Feels well now.  Reports this is never happened more than once in a day.  States he would not have contacted EMS, coworker did  Past Medical History:  Diagnosis Date  . Non Hodgkin's lymphoma ([place]) 1998  . Seizures ([place])     There are no active problems to display for this patient.   Past Surgical History:  Procedure Laterality Date  . EYE SURGERY Left 1998  . WISDOM TOOTH EXTRACTION      Prior to Admission medications   Medication Sig Start Date End Date Taking? Authorizing Provider  ketorolac (TORADOL) 10 MG tablet Take 1 tablet (10 mg total) by mouth every 6 (six) hours as needed for moderate pain. 10/22/18   Carrie Mew, MD  levETIRAcetam (KEPPRA) 500 MG tablet Take 1 tablet (500 mg total) by mouth 2 (two) times daily. 11/25/18   Lavonia Drafts, MD  montelukast (SINGULAIR) 10 MG tablet Take 10 mg by mouth daily as needed (allergy symptom).    [provider]     Allergies Patient has no known allergies.  History reviewed. No pertinent family history.  Social History Social History   Tobacco Use  . Smoking status: Never Smoker  . Smokeless tobacco: Never  Used  Substance Use Topics  . Alcohol use: Yes    Comment: once/week  . Drug use: No    Review of Systems  Constitutional: No fever/chills Eyes: No visual changes.  ENT: No sore throat. Cardiovascular: Denies chest pain. Respiratory: Denies shortness of breath. Gastrointestinal: No abdominal pain.     Genitourinary: Negative for dysuria. Musculoskeletal: Negative for back pain. Skin: Negative for rash. Neurological: Negative for headaches or weakness   ____________________________________________   PHYSICAL EXAM:  VITAL SIGNS: ED Triage Vitals  Enc Vitals Group     BP 11/25/18 0855 122/80     Pulse Rate 11/25/18 0855 (!) 101     Resp 11/25/18 0855 16     Temp 11/25/18 0855 97.8 F (36.6 C)     Temp Source 11/25/18 0855 Oral     SpO2 11/25/18 0855 96 %     Weight 11/25/18 0856 74.8 kg (165 lb)     Height 11/25/18 0856 1.778 m (5\' 10" )     Head Circumference --      Peak Flow --      Pain Score 11/25/18 0855 2     Pain Loc --      Pain Edu? --      Excl. in [place]? --     Constitutional: Alert and oriented. No acute distress.    Head: Atraumatic. Nose: No swelling or epistaxis Mouth/Throat: Mucous membranes are moist.   Neck:  Painless ROM Cardiovascular: Normal rate, regular rhythm.   Good peripheral circulation.  No chest wall tenderness Respiratory: Normal respiratory effort.  No retractions.  Gastrointestinal: Soft and nontender. No distention.    Musculoskeletal:  Warm and well perfused, moves all extremities without difficulty Neurologic:  Normal speech and language. No gross focal neurologic deficits are appreciated.  Skin:  Skin is warm, dry and intact. No rash noted. Psychiatric: Mood and affect are normal. Speech and behavior are normal.  ____________________________________________   LABS (all labs ordered are listed, but only abnormal results are displayed)  Labs Reviewed - No data to display ____________________________________________  EKG   ED ECG REPORT I, Lavonia Drafts, the attending physician, personally viewed and interpreted this ECG.  Date: 11/25/2018  Rhythm: Sinus tachycardia QRS Axis: normal Intervals: normal ST/T Wave abnormalities: normal Narrative Interpretation: no evidence of acute ischemia  ____________________________________________  RADIOLOGY  None ____________________________________________   PROCEDURES  Procedure(s) performed: No  Procedures   Critical Care performed: No ____________________________________________   INITIAL IMPRESSION / ASSESSMENT AND PLAN / ED COURSE  Pertinent labs & imaging results that were available during my care of the patient were reviewed by me and considered in my medical decision making (see chart for details).  Patient presents after reported syncopal/seizure episode.  Is quite well-appearing and is at baseline currently.  He has neurology follow-up arranged and plans on further eval for seizures.  Wants to be restarted on Keppra.  Recommended that he be observed in the emergency department but he declined stating that he feels well and does not need to stay and wished that his coworker had not called EMS.  He does not want any lab work or testing done at this time, request to be discharged    ____________________________________________   FINAL CLINICAL IMPRESSION(S) / ED DIAGNOSES  Final diagnoses:  Seizure ([name])        Note:  This document was prepared using Dragon voice recognition software and may include unintentional dictation errors.   Lavonia Drafts, MD 11/25/18 [phone]

## 2018-11-25 NOTE — ED Triage Notes (Signed)
Pt via EMS from work, pt has a hx of seizures but not currently on meds. Had a seizure about a month ago and is suppose to have a follow-up with neurology on Monday 10/26. Witnesses at his work states it last about 5 mins and on EMS arrival to his job, he was post-ictal. Pt was feeling nauseous pt was given 4 of Zofran by EMS. Pt states that exhaustion usually triggers his seizures and he didn't get much sleep last night.  EMS vital 137/84, 95% on RA, 137 CBG, and sinus tach on the monitor 103

## 2019-04-09 ENCOUNTER — Ambulatory Visit: Payer: Managed Care, Other (non HMO) | Attending: Internal Medicine

## 2019-04-09 DIAGNOSIS — Z23 Encounter for immunization: Secondary | ICD-10-CM | POA: Insufficient documentation

## 2019-04-09 NOTE — Progress Notes (Signed)
   Covid-19 Vaccination Clinic  Name:  Carl Walter    MRN: [id number] DOB: 03-06-1985  04/09/2019  [name] was observed post Covid-19 immunization for 15 minutes without incident. He was provided with Vaccine Information Sheet and instruction to access the V-Safe system.   [name] was instructed to call 911 with any severe reactions post vaccine: Marland Kitchen Difficulty breathing  . Swelling of face and throat  . A fast heartbeat  . A bad rash all over body  . Dizziness and weakness   Immunizations Administered    Name Date Dose VIS Date Route   Pfizer COVID-19 Vaccine 04/09/2019 12:51 PM 0.3 mL 01/13/2019 Intramuscular   Manufacturer: [place]   Lot: [id number]   [place]: [id number]

## 2019-04-12 ENCOUNTER — Emergency Department: Payer: Managed Care, Other (non HMO)

## 2019-04-12 ENCOUNTER — Ambulatory Visit
Admission: EM | Admit: 2019-04-12 | Discharge: 2019-04-12 | Disposition: A | Discharge status: Home / Self Care | Payer: Managed Care, Other (non HMO) | Attending: Emergency Medicine | Admitting: Emergency Medicine

## 2019-04-12 ENCOUNTER — Encounter
Admission: EM | Disposition: A | Discharge status: Home / Self Care | Payer: Self-pay | Source: Home / Self Care | Attending: Emergency Medicine

## 2019-04-12 ENCOUNTER — Encounter: Payer: Self-pay | Admitting: Emergency Medicine

## 2019-04-12 ENCOUNTER — Other Ambulatory Visit: Payer: Self-pay

## 2019-04-12 ENCOUNTER — Emergency Department: Payer: Managed Care, Other (non HMO) | Admitting: Registered Nurse

## 2019-04-12 DIAGNOSIS — W19XXXA Unspecified fall, initial encounter: Secondary | ICD-10-CM | POA: Insufficient documentation

## 2019-04-12 DIAGNOSIS — Z791 Long term (current) use of non-steroidal anti-inflammatories (NSAID): Secondary | ICD-10-CM | POA: Insufficient documentation

## 2019-04-12 DIAGNOSIS — Z20822 Contact with and (suspected) exposure to covid-19: Secondary | ICD-10-CM | POA: Diagnosis not present

## 2019-04-12 DIAGNOSIS — Z79899 Other long term (current) drug therapy: Secondary | ICD-10-CM | POA: Insufficient documentation

## 2019-04-12 DIAGNOSIS — R55 Syncope and collapse: Secondary | ICD-10-CM | POA: Diagnosis not present

## 2019-04-12 DIAGNOSIS — Z419 Encounter for procedure for purposes other than remedying health state, unspecified: Secondary | ICD-10-CM

## 2019-04-12 DIAGNOSIS — R569 Unspecified convulsions: Secondary | ICD-10-CM | POA: Diagnosis not present

## 2019-04-12 DIAGNOSIS — S43014A Anterior dislocation of right humerus, initial encounter: Secondary | ICD-10-CM | POA: Diagnosis present

## 2019-04-12 DIAGNOSIS — Z8572 Personal history of non-Hodgkin lymphomas: Secondary | ICD-10-CM | POA: Insufficient documentation

## 2019-04-12 DIAGNOSIS — Y9389 Activity, other specified: Secondary | ICD-10-CM | POA: Insufficient documentation

## 2019-04-12 HISTORY — PX: SHOULDER CLOSED REDUCTION: SHX1051

## 2019-04-12 LAB — CBC
HCT: 44.9 % (ref 39.0–52.0)
Hemoglobin: 15.1 g/dL (ref 13.0–17.0)
MCH: 31.4 pg (ref 26.0–34.0)
MCHC: 33.6 g/dL (ref 30.0–36.0)
MCV: 93.3 fL (ref 80.0–100.0)
Platelets: 260 10*3/uL (ref 150–400)
RBC: 4.81 MIL/uL (ref 4.22–5.81)
RDW: 11.9 % (ref 11.5–15.5)
WBC: 6.1 10*3/uL (ref 4.0–10.5)
nRBC: 0 % (ref 0.0–0.2)

## 2019-04-12 LAB — RESPIRATORY PANEL BY RT PCR (FLU A&B, COVID)
Influenza A by PCR: NEGATIVE
Influenza B by PCR: NEGATIVE
SARS Coronavirus 2 by RT PCR: NEGATIVE

## 2019-04-12 LAB — BASIC METABOLIC PANEL
Anion gap: 17 — ABNORMAL HIGH (ref 5–15)
BUN: 16 mg/dL (ref 6–20)
CO2: 18 mmol/L — ABNORMAL LOW (ref 22–32)
Calcium: 9.1 mg/dL (ref 8.9–10.3)
Chloride: 103 mmol/L (ref 98–111)
Creatinine, Ser: 1.25 mg/dL — ABNORMAL HIGH (ref 0.61–1.24)
GFR calc Af Amer: >60 mL/min (ref >60)
GFR calc non Af Amer: >60 mL/min (ref >60)
Glucose, Bld: 158 mg/dL — ABNORMAL HIGH (ref 70–99)
Potassium: 3.9 mmol/L (ref 3.5–5.1)
Sodium: 138 mmol/L (ref 135–145)

## 2019-04-12 SURGERY — CLOSED REDUCTION, SHOULDER
Anesthesia: General | Site: Shoulder | Laterality: Right

## 2019-04-12 MED ORDER — PROPOFOL 10 MG/ML IV BOLUS
INTRAVENOUS | Status: AC
  Filled 2019-04-12: qty 20

## 2019-04-12 MED ORDER — METOCLOPRAMIDE HCL 5 MG/ML IJ SOLN: 5.0000 mg | Freq: Three times a day (TID) | INTRAMUSCULAR | Status: DC | PRN

## 2019-04-12 MED ORDER — MIDAZOLAM HCL 2 MG/2ML IJ SOLN
INTRAMUSCULAR | Status: DC | PRN
  Administered 2019-04-12: 2 mg via INTRAVENOUS

## 2019-04-12 MED ORDER — FENTANYL CITRATE (PF) 100 MCG/2ML IJ SOLN
INTRAMUSCULAR | Status: DC | PRN
  Administered 2019-04-12: 50 ug via INTRAVENOUS

## 2019-04-12 MED ORDER — FENTANYL CITRATE (PF) 100 MCG/2ML IJ SOLN
INTRAMUSCULAR | Status: AC
  Filled 2019-04-12: qty 2

## 2019-04-12 MED ORDER — MORPHINE SULFATE (PF) 4 MG/ML IV SOLN: 4.0000 mg | Freq: Once | INTRAVENOUS | Status: DC

## 2019-04-12 MED ORDER — PROPOFOL 500 MG/50ML IV EMUL
INTRAVENOUS | Status: DC | PRN
  Administered 2019-04-12: 30 mg via INTRAVENOUS
  Administered 2019-04-12: 100 mg via INTRAVENOUS

## 2019-04-12 MED ORDER — FENTANYL CITRATE (PF) 100 MCG/2ML IJ SOLN
50.0000 ug | Freq: Once | INTRAMUSCULAR | Status: AC
  Administered 2019-04-12: 10:00:00 50 ug via INTRAVENOUS

## 2019-04-12 MED ORDER — TRAMADOL HCL 50 MG PO TABS: 50.0000 mg | ORAL_TABLET | Freq: Four times a day (QID) | ORAL | Status: DC | PRN

## 2019-04-12 MED ORDER — KETOROLAC TROMETHAMINE 30 MG/ML IJ SOLN
30.0000 mg | Freq: Once | INTRAMUSCULAR | Status: AC
  Administered 2019-04-12: 30 mg via INTRAVENOUS
  Filled 2019-04-12: qty 1

## 2019-04-12 MED ORDER — ONDANSETRON HCL 4 MG/2ML IJ SOLN
INTRAMUSCULAR | Status: DC | PRN
  Administered 2019-04-12: 4 mg via INTRAVENOUS

## 2019-04-12 MED ORDER — FENTANYL CITRATE (PF) 100 MCG/2ML IJ SOLN: 25.0000 ug | INTRAMUSCULAR | Status: DC | PRN

## 2019-04-12 MED ORDER — POTASSIUM CHLORIDE IN NACL 20-0.9 MEQ/L-% IV SOLN: INTRAVENOUS | Status: DC

## 2019-04-12 MED ORDER — ONDANSETRON HCL 4 MG/2ML IJ SOLN: 4.0000 mg | Freq: Four times a day (QID) | INTRAMUSCULAR | Status: DC | PRN

## 2019-04-12 MED ORDER — LIDOCAINE HCL (CARDIAC) PF 100 MG/5ML IV SOSY
PREFILLED_SYRINGE | INTRAVENOUS | Status: DC | PRN
  Administered 2019-04-12: 50 mg via INTRAVENOUS

## 2019-04-12 MED ORDER — MIDAZOLAM HCL 2 MG/2ML IJ SOLN
INTRAMUSCULAR | Status: AC
  Filled 2019-04-12: qty 2

## 2019-04-12 MED ORDER — ONDANSETRON HCL 4 MG/2ML IJ SOLN: 4.0000 mg | Freq: Once | INTRAMUSCULAR | Status: DC | PRN

## 2019-04-12 MED ORDER — METOCLOPRAMIDE HCL 10 MG PO TABS: 5.0000 mg | ORAL_TABLET | Freq: Three times a day (TID) | ORAL | Status: DC | PRN

## 2019-04-12 MED ORDER — ONDANSETRON HCL 4 MG PO TABS: 4.0000 mg | ORAL_TABLET | Freq: Four times a day (QID) | ORAL | Status: DC | PRN

## 2019-04-12 MED ORDER — SODIUM CHLORIDE 0.9 % IV BOLUS
1000.0000 mL | Freq: Once | INTRAVENOUS | Status: AC
  Administered 2019-04-12: 1000 mL via INTRAVENOUS

## 2019-04-12 SURGICAL SUPPLY — 3 items
COVER WAND RF STERILE (DRAPES) ×3 IMPLANT
IMMOBILIZER SHDR MD LX WHT (SOFTGOODS) ×3 IMPLANT
KIT TURNOVER KIT A (KITS) ×3 IMPLANT

## 2019-04-12 NOTE — Anesthesia Postprocedure Evaluation (Signed)
Anesthesia Post Note  Patient: Carl Walter  Procedure(s) Performed: CLOSED REDUCTION SHOULDER (Right Shoulder)  Patient location during evaluation: PACU Anesthesia Type: General Level of consciousness: awake and alert and oriented Pain management: pain level controlled Vital Signs Assessment: post-procedure vital signs reviewed and stable Respiratory status: spontaneous breathing, nonlabored ventilation and respiratory function stable Cardiovascular status: blood pressure returned to baseline and stable Postop Assessment: no signs of nausea or vomiting Anesthetic complications: no     Last Vitals:  Vitals:   04/12/19 1445 04/12/19 1502  BP: 125/72 (!) 113/59  Pulse: 97 99  Resp: 19 16  Temp:  37.2 C  SpO2: 97% 100%    Last Pain:  Vitals:   04/12/19 1502  TempSrc: Temporal  PainSc: 0-No pain                 Diaz Crago

## 2019-04-12 NOTE — Discharge Instructions (Addendum)
       AMBULATORY SURGERY  DISCHARGE INSTRUCTIONS   1) The drugs that you were given will stay in your system until tomorrow so for the next 24 hours you should not:  A) Drive an automobile B) Make any legal decisions C) Drink any alcoholic beverage   2) You may resume regular meals tomorrow.  Today it is better to start with liquids and gradually work up to solid foods.  You may eat anything you prefer, but it is better to start with liquids, then soup and crackers, and gradually work up to solid foods.   3) Please notify your doctor immediately if you have any unusual bleeding, trouble breathing, redness and pain at the surgery site, drainage, fever, or pain not relieved by medication.    4) Additional Instructions:        Please contact your physician with any problems or Same Day Surgery at [phone], Monday through Friday 6 am to 4 pm, or [place] [facility] number at [phone].Orthopedic discharge instructions: May shower. Apply ice frequently to shoulder. Take ibuprofen 600-800 mg TID with meals for 5-7 days, then as necessary. May supplement with ES Tylenol if necessary. Keep shoulder immobilizer on at all times except may remove for bathing purposes. Follow-up in 10-14 days with either myself or my physician assistant, Cameron Proud, PA-C.

## 2019-04-12 NOTE — Transfer of Care (Signed)
Immediate Anesthesia Transfer of Care Note  Patient: Carl Walter  Procedure(s) Performed: CLOSED REDUCTION SHOULDER (Right Shoulder)  Patient Location: PACU  Anesthesia Type:MAC  Level of Consciousness: awake, alert  and oriented  Airway & Oxygen Therapy: Patient Spontanous Breathing  Post-op Assessment: Report given to RN and Post -op Vital signs reviewed and stable  Post vital signs: Reviewed and stable  Last Vitals:  Vitals Value Taken Time  BP 128/70 04/12/19 1431  Temp    Pulse 103 04/12/19 1432  Resp 14 04/12/19 1432  SpO2 96 % 04/12/19 1432  Vitals shown include unvalidated device data.  Last Pain:  Vitals:   04/12/19 1355  TempSrc: Tympanic  PainSc: 0-No pain         Complications: No apparent anesthesia complications

## 2019-04-12 NOTE — ED Provider Notes (Signed)
[facility] Emergency Department Provider Note   ____________________________________________   First MD Initiated Contact with Patient 04/12/19 0840     (approximate)  I have reviewed the triage vital signs and the nursing notes.   HISTORY  Chief Complaint Seizures    HPI Carl Walter is a 34 y.o. male is a history of multiple episodes of seizures versus possible syncope  Patient reports that he has had about 7 episodes of the same, he will often be up standing and he will feel lightheaded like he is get a pass out.  He will then passed out.  Often times he seemed to have shaking afterward.  He is seen neurology, Dr. Melrose Nakayama, also recently saw cardiology who gave him a good report.  Tells me Dr. Melrose Nakayama is thinking the patient may be having episodes of syncope that look like seizures.  He is no longer on any seizure medications.  Witnesses report patient was standing, he was found to pass out and shaking for several minutes  He denies any headache.  Of note, he is fully awake and alert well oriented and does not appear to be postictal at this time.  Denies any injury except his right shoulder is sore.  However he reports he is dislocated the shoulder twice in the past, but it does not feel like a dislocation because normally he cannot move it at all, but at this point is able to move it some but feels limited with pain across the front of the right shoulder   Past Medical History:  Diagnosis Date  . Non Hodgkin's lymphoma ([place]) 1998  . Seizures ([place])     There are no problems to display for this patient.   Past Surgical History:  Procedure Laterality Date  . EYE SURGERY Left 1998  . WISDOM TOOTH EXTRACTION      Prior to Admission medications   Medication Sig Start Date End Date Taking? Authorizing Provider  levETIRAcetam (KEPPRA) 500 MG tablet Take 1 tablet (500 mg total) by mouth 2 (two) times daily. 11/25/18  Yes Lavonia Drafts, MD    ketorolac (TORADOL) 10 MG tablet Take 1 tablet (10 mg total) by mouth every 6 (six) hours as needed for moderate pain. Patient not taking: Reported on 04/12/2019 10/22/18   Carrie Mew, MD  montelukast (SINGULAIR) 10 MG tablet Take 10 mg by mouth daily as needed (allergy symptom).    [provider]    Allergies Patient has no known allergies.  History reviewed. No pertinent family history.  Social History Social History   Tobacco Use  . Smoking status: Never Smoker  . Smokeless tobacco: Never Used  Substance Use Topics  . Alcohol use: Yes    Comment: once/week  . Drug use: No    Review of Systems Constitutional: No fever/chills Eyes: No visual changes. ENT: No sore throat.  No neck pain. Cardiovascular: Denies chest pain. Respiratory: Denies shortness of breath. Gastrointestinal: No abdominal pain.   Genitourinary: Negative for dysuria. Musculoskeletal: Negative for back pain.  See HPI regarding pain in the right shoulder. Skin: Negative for rash. Neurological: Negative for headaches, areas of focal weakness or numbness.  Did not urinate on himself.  Did not bite his tongue.  Reports he went to work at 8 AM, had not anything to eat or drink prior to going to work.  Last oral intake was about midnight ____________________________________________   PHYSICAL EXAM:  VITAL SIGNS: ED Triage Vitals  Enc Vitals Group  BP      Pulse      Resp      Temp      Temp src      SpO2      Weight      Height      Head Circumference      Peak Flow      Pain Score      Pain Loc      Pain Edu?      Excl. in [place]?     Constitutional: Alert and oriented. Well appearing and in no acute distress.  He does not appear postictal. Eyes: Conjunctivae are normal. Head: Atraumatic. Nose: No congestion/rhinnorhea. Mouth/Throat: Mucous membranes are slightly dry. Neck: No stridor.  Cardiovascular: Normal rate, regular rhythm. Grossly normal heart sounds.  Good  peripheral circulation. Respiratory: Normal respiratory effort.  No retractions. Lungs CTAB. Gastrointestinal: Soft and nontender. No distention. Musculoskeletal:   RIGHT Limitation of range of motion at the shoulder, able to abduct about 30 degrees but then reports pain limiting full range of motion.  Tenderness only over the right shoulder itself, no tenderness along the mid humerus elbow forearm or wrist/hand.  No edema bruising or contusions of the right shoulder/upper arm, right elbow, right forearm / hand though there is a slight palpable defect anterior right shoulder without lesion.  Strong radial pulse. Intact median/ulnar/radial neuro-muscular exam.  Normal axillary nerve exam  LEFT Left upper extremity demonstrates normal strength, good use of all muscles. No edema bruising or contusions of the left shoulder/upper arm, left elbow, left forearm / hand. Full range of motion of the left  upper extremity without pain. No evidence of trauma. Strong radial pulse. Intact median/ulnar/radial neuro-muscular exam.   Neurologic:  Normal speech and language. No gross focal neurologic deficits are appreciated.  Skin:  Skin is warm, dry and intact. No rash noted. Psychiatric: Mood and affect are normal. Speech and behavior are normal.  ____________________________________________   LABS (all labs ordered are listed, but only abnormal results are displayed)  Labs Reviewed  BASIC METABOLIC PANEL - Abnormal; Notable for the following components:      Result Value   CO2 18 (*)    Glucose, Bld 158 (*)    Creatinine, Ser 1.25 (*)    Anion gap 17 (*)    All other components within normal limits  RESPIRATORY PANEL BY RT PCR (FLU A&B, COVID)  CBC   ____________________________________________  EKG  Reviewed inter by me, heart rate 110 Reviewed and interpreted at 8:40 AM Heart rate QRS 90 PR interval normal QTc 440.  Sinus tachycardia.  There is no evidence of long QT syndrome, [place], or  obvious conduction abnormality or ischemia ____________________________________________  RADIOLOGY  DG Shoulder Right  Result Date: 04/12/2019 CLINICAL DATA:  Right shoulder dislocation.  Attempted reduction EXAM: RIGHT SHOULDER - 2+ VIEW COMPARISON:  Same day radiographs FINDINGS: The right glenohumeral joint remains anteriorly dislocated. No obvious fracture. AC joint is intact and unremarkable. IMPRESSION: 1. Right glenohumeral joint remains anteriorly dislocated. 2. No obvious fracture.  Attention on postreduction views. Electronically Signed   By: Davina Poke D.O.   On: 04/12/2019 11:59   DG Shoulder Right  Result Date: 04/12/2019 CLINICAL DATA:  Seizure, fall onto right shoulder. EXAM: RIGHT SHOULDER - 2+ VIEW COMPARISON:  10/12/2018 FINDINGS: Anterior dislocation of the right shoulder. No visible fracture. AC joint appears intact. IMPRESSION: Anterior right shoulder dislocation Electronically Signed   By: Rolm Baptise M.D.   On:  04/12/2019 09:34    Imaging reviewed, positive for right shoulder dislocation ____________________________________________   PROCEDURES  Procedure(s) performed: None  Reduction of dislocation  Date/Time: 04/12/2019 2:17 PM Performed by: Delman Kitten, MD Authorized by: Delman Kitten, MD  Consent: Verbal consent obtained. Written consent not obtained. Risks and benefits: risks, benefits and alternatives were discussed Consent given by: patient Patient understanding: patient states understanding of the procedure being performed Imaging studies: imaging studies available Patient identity confirmed: verbally with patient and arm band Time out: Immediately prior to procedure a "time out" was called to verify the correct patient, procedure, equipment, support staff and site/side marked as required. Local anesthesia used: no  Anesthesia: Local anesthesia used: no  Sedation: Patient sedated: no  Patient tolerance: patient tolerated the procedure well  with no immediate complications Comments: Unsuccessful R shoulder reduction.  Normal distal motor neurologic exam post attempt     Critical Care performed: No  ____________________________________________   INITIAL IMPRESSION / ASSESSMENT AND PLAN / ED COURSE  Pertinent labs & imaging results that were available during my care of the patient were reviewed by me and considered in my medical decision making (see chart for details).   Patient presents for evaluation after episode where he passed out.  His symptoms sound syncopal in nature.  He has been following with cardiology as well as neurology, mostly neurology recent cardiology visit  No recent illness.  No fevers or chills.  Does associate some right shoulder pain as well concerning for dislocation.  His EKG and lab work are reassuring.  Fully alert without postictal.  Clinical Course as of Apr 11 1416  Wed Apr 12, 2019  1129 Attempted reduction with FARES as well as with traction with weights 15 pounds on over about 20 minutes.  Patient reports he felt at one point like his shoulder moved back in, but on exam he still seems to have a slight anterior defect though he does report his pain and symptoms are quite a bit better.  He is not having any ongoing pain.  Distal intact neurologic and vascular exam.  Will obtain imaging to evaluate for possible reduction no I am not quite certain   [MQ]  [id number] Of note, patient name date of birth procedure and you laterality including right-sided shoulder reduction were confirmed with the patient prior to attempts.   [MQ]  1130   Patient verbally consenting to reduction after discussing plan and risks benefits   [MQ]  1227 Discussed with Dr. Roland Rack, and I also reviewed the patient's previous sedation with Dr. Joni Fears that was complicated by vomiting.  Patient reports he has a history of becoming combative after sedation as well.  In this setting, with his history of vomiting after receiving etomidate  and propofol as well as self-reported being combative after sedation, I have talked to Dr. Roland Rack. Dr. Roland Rack will see patient in about 2 hours, requests COVID test and likely take patient to OR for reducation.  Discussed with the patient, he is understanding agreeable   [MQ]    Clinical Course User Index [MQ] Delman Kitten, MD    Patient taken to the operating room with orthopedics for concerns of difficult reduction of his right shoulder and previous history of complications during sedation. Dr. Roland Rack ____________________________________________   FINAL CLINICAL IMPRESSION(S) / ED DIAGNOSES  Final diagnoses:  Syncope and collapse  Anterior dislocation of right shoulder, initial encounter        Note:  This document was prepared using Dragon voice recognition  software and may include unintentional dictation errors       Delman Kitten, MD 04/12/19 1419

## 2019-04-12 NOTE — Op Note (Signed)
04/12/2019  2:48 PM  Patient:   Carl Walter  Pre-Op Diagnosis:   Irreducible anterior dislocation of right glenohumeral joint.  Post-Op Diagnosis:   Same  Procedure:   Closed reduction of right glenohumeral joint.  Surgeon:   Pascal Lux, MD  Assistant:   None  Anesthesia:   IV sedation  Findings:   As above.  Complications:   None  Fluids:   400 cc crystalloid  EBL:   0 cc  UOP:   None  TT:   None  Drains:   None  Closure:   None  Brief Clinical Note:   The patient is a 34 year old male who apparently sustained a syncopal episode while at work today, causing him to fall and dislocated his right shoulder.  Several attempts were made to reduce the shoulder in the emergency room without success.  The patient is now being brought to the emergency room for closed reduction under anesthesia.  Procedure:   The patient was brought into the operating room and lain in the supine position.  After adequate IV sedation was achieved, the shoulder was reduced using a traction/countertraction technique with 2 bedsheets.  The shoulder relocated with a gentle thump.  Subsequent imaging with C-arm demonstrated anatomic reduction of the glenohumeral joint without any associated fractures.  However, it appears that he does have a Hill-Sachs lesion.  The patient was placed into a shoulder immobilizer before being awakened and returned to the recovery room in satisfactory condition after tolerating the procedure well.

## 2019-04-12 NOTE — Anesthesia Preprocedure Evaluation (Signed)
Anesthesia Evaluation  Patient identified by MRN, date of birth, ID band Patient awake    Reviewed: Allergy & Precautions, NPO status , Patient's Chart, lab work & pertinent test results  History of Anesthesia Complications Negative for: history of anesthetic complications  Airway Mallampati: II  TM Distance: >3 FB Neck ROM: Full    Dental no notable dental hx.    Pulmonary neg pulmonary ROS, neg sleep apnea, neg COPD,    breath sounds clear to auscultation- rhonchi (-) wheezing      Cardiovascular Exercise Tolerance: Good (-) hypertension(-) CAD and (-) Past MI  Rhythm:Regular Rate:Normal - Systolic murmurs and - Diastolic murmurs    Neuro/Psych Seizures - (recently stopped antiepileptics, changing over to new medication),  negative psych ROS   GI/Hepatic negative GI ROS, Neg liver ROS,   Endo/Other  negative endocrine ROSneg diabetes  Renal/GU negative Renal ROS     Musculoskeletal negative musculoskeletal ROS (+)   Abdominal (+) - obese,   Peds  Hematology negative hematology ROS (+)   Anesthesia Other Findings Past Medical History: 1998: Non Hodgkin's lymphoma ([place]) No date: Seizures ([place])   Reproductive/Obstetrics                             Anesthesia Physical Anesthesia Plan  ASA: II  Anesthesia Plan: General   Post-op Pain Management:    Induction: Intravenous  PONV Risk Score and Plan: 1 and Propofol infusion  Airway Management Planned: Natural Airway  Additional Equipment:   Intra-op Plan:   Post-operative Plan:   Informed Consent: I have reviewed the patients History and Physical, chart, labs and discussed the procedure including the risks, benefits and alternatives for the proposed anesthesia with the patient or authorized representative who has indicated his/her understanding and acceptance.     Dental advisory given  Plan Discussed with: CRNA and  Anesthesiologist  Anesthesia Plan Comments: (Discussed possible intubation if paralytics are required)        Anesthesia Quick Evaluation

## 2019-04-12 NOTE — ED Triage Notes (Signed)
Pt presents from work via [employer] with c/o witnessed seizure. witnesses state seizure lasted about 10 minutes. Last known seizure was 3 months ago. Recently taken off medication for seizures 2 weeks ago. Alert and oriented x4 at this time. Pt c/o right sholulder pain and biting tongue as well. Witnesses state pt lowered self to ground before seizure began and did not hit head. Blood sugar 128.

## 2019-04-12 NOTE — H&P (Signed)
Subjective:  Chief complaint: Right shoulder pain.  The patient is a 34 y.o. male who sustained an injury to the right shoulder earlier today when he apparently sustained a syncopal episode while at work and fell, injuring his shoulder.  He was brought to the emergency room where an attempt was made to relocate to the shoulder under sedation with several maneuvers by the ER provider without success.  Therefore, I was asked to step in and reduce his shoulder.  Apparently, the patient has had this same injury happen to him once previously in October, 2020.  The shoulder was reduced with some difficulty in the emergency room under sedation.  The patient denies any associated injury.  The patient did not strike his head, and denies any chest pain or shortness of breath which might have contributed to the injury.  The patient notes that he has had a syncopal episode on an almost monthly basis over the past 6 months.  He has undergone a cardiac work-up already which has not demonstrated any obvious cause of these episodes.  He does note moderate pain in the shoulder, but denies any numbness or paresthesias down his arm to his hand.  There are no problems to display for this patient.  Past Medical History:  Diagnosis Date  . Non Hodgkin's lymphoma ([place]) 1998  . Seizures ([place])     Past Surgical History:  Procedure Laterality Date  . EYE SURGERY Left 1998  . WISDOM TOOTH EXTRACTION      Medications Prior to Admission  Medication Sig Dispense Refill Last Dose  . levETIRAcetam (KEPPRA) 500 MG tablet Take 1 tablet (500 mg total) by mouth 2 (two) times daily. 60 tablet 1 Past Week at Unknown time  . ketorolac (TORADOL) 10 MG tablet Take 1 tablet (10 mg total) by mouth every 6 (six) hours as needed for moderate pain. (Patient not taking: Reported on 04/12/2019) 12 tablet 0 Not Taking at Unknown time  . montelukast (SINGULAIR) 10 MG tablet Take 10 mg by mouth daily as needed (allergy symptom).   Not Taking at  Unknown time   No Known Allergies  Social History   Tobacco Use  . Smoking status: Never Smoker  . Smokeless tobacco: Never Used  Substance Use Topics  . Alcohol use: Yes    Comment: once/week    History reviewed. No pertinent family history.   Review of Systems: As noted above. The patient denies any chest pain, shortness of breath, nausea, vomiting, diarrhea, constipation, belly pain, blood in his/her stool, or burning with urination.  Objective: Temp:  [98 F (36.7 C)-98.7 F (37.1 C)] 98.7 F (37.1 C) (03/10 1355) Pulse Rate:  [93-107] 93 (03/10 1355) Resp:  [20] 20 (03/10 1355) BP: (124-138)/(81-92) 124/92 (03/10 1355) SpO2:  [98 %-100 %] 100 % (03/10 1355) Weight:  [74.8 kg-77.1 kg] 74.8 kg (03/10 1355)  Physical Exam: General:  Alert, no acute distress Psychiatric:  Patient is competent for consent with normal mood and affect Cardiovascular:  RRR  Respiratory:  Clear to auscultation. No wheezing. Non-labored breathing GI:  Abdomen is soft and non-tender Skin:  No lesions in the area of chief complaint Neurologic:  Sensation intact distally Lymphatic:  No axillary or cervical lymphadenopathy  Orthopedic Exam:  Orthopedic examination is limited to the right shoulder and upper extremity.  There is an obvious deformity of the shoulder with prominence anteriorly.  He has mild tenderness diffusely around the shoulder.  He has more severe pain with any attempted active or  passive motion of the shoulder.  He is able to actively flex and extend his elbow, wrist, and all digits.  Sensation is intact to light touch to all distributions, including the axillary nerve and lateral antebrachial cutaneous nerve distributions.  He is neurovascular intact to his right upper extremity and hand.  Imaging Review: Recent x-rays of the right shoulder are available for review.  These films demonstrate an anterior dislocation of the right glenohumeral joint without evidence for any obvious  glenoid or humeral fractures.  No significant degenerative changes or lytic lesions are identified.  Assessment: Irreducible closed dislocation right shoulder.  Plan: The treatment options, including both surgical and nonsurgical choices, have been discussed in detail with the patient. The risks (including bleeding, infection, nerve and/or blood vessel injury, persistent or recurrent pain, recurrent dislocation, development of degenerative joint disease, need for further surgery, blood clots, strokes, heart attacks or arrhythmias, pneumonia, etc.) and benefits of the surgical procedure were discussed. The patient states his understanding and agrees to proceed. A formal written consent will be obtained by the nursing staff.

## 2019-05-01 ENCOUNTER — Other Ambulatory Visit: Payer: Self-pay | Admitting: Student

## 2019-05-01 DIAGNOSIS — S41001A Unspecified open wound of right shoulder, initial encounter: Secondary | ICD-10-CM

## 2019-05-01 DIAGNOSIS — M21821 Other specified acquired deformities of right upper arm: Secondary | ICD-10-CM

## 2019-05-09 ENCOUNTER — Ambulatory Visit: Payer: Managed Care, Other (non HMO) | Attending: Internal Medicine

## 2019-05-09 DIAGNOSIS — Z23 Encounter for immunization: Secondary | ICD-10-CM

## 2019-05-09 NOTE — Progress Notes (Signed)
   Covid-19 Vaccination Clinic  Name:  Carl Walter    MRN: [id number] DOB: [birth year]  05/09/2019  Mr. Carl Walter was observed post Covid-19 immunization for 15 minutes without incident. He was provided with Vaccine Information Sheet and instruction to access the V-Safe system.   Mr. Carl Walter was instructed to call 911 with any severe reactions post vaccine: Marland Kitchen Difficulty breathing  . Swelling of face and throat  . A fast heartbeat  . A bad rash all over body  . Dizziness and weakness   Immunizations Administered    Name Date Dose VIS Date Route   Pfizer COVID-19 Vaccine 05/09/2019 12:05 PM 0.3 mL 01/13/2019 Intramuscular   Manufacturer: [place]   Lot: [id number]   [place]: [id number]

## 2019-05-15 ENCOUNTER — Ambulatory Visit: Payer: Managed Care, Other (non HMO)

## 2019-06-01 ENCOUNTER — Emergency Department: Payer: Managed Care, Other (non HMO)

## 2019-06-01 ENCOUNTER — Other Ambulatory Visit: Payer: Self-pay

## 2019-06-01 ENCOUNTER — Encounter: Payer: Self-pay | Admitting: Emergency Medicine

## 2019-06-01 ENCOUNTER — Emergency Department
Admission: EM | Admit: 2019-06-01 | Discharge: 2019-06-01 | Disposition: A | Discharge status: Home / Self Care | Payer: Managed Care, Other (non HMO) | Attending: Student | Admitting: Student

## 2019-06-01 DIAGNOSIS — Y990 Civilian activity done for income or pay: Secondary | ICD-10-CM | POA: Insufficient documentation

## 2019-06-01 DIAGNOSIS — Z79899 Other long term (current) drug therapy: Secondary | ICD-10-CM | POA: Insufficient documentation

## 2019-06-01 DIAGNOSIS — X500XXA Overexertion from strenuous movement or load, initial encounter: Secondary | ICD-10-CM | POA: Insufficient documentation

## 2019-06-01 DIAGNOSIS — Z8572 Personal history of non-Hodgkin lymphomas: Secondary | ICD-10-CM | POA: Diagnosis not present

## 2019-06-01 DIAGNOSIS — M24411 Recurrent dislocation, right shoulder: Secondary | ICD-10-CM | POA: Diagnosis not present

## 2019-06-01 DIAGNOSIS — Y9289 Other specified places as the place of occurrence of the external cause: Secondary | ICD-10-CM | POA: Insufficient documentation

## 2019-06-01 DIAGNOSIS — M25511 Pain in right shoulder: Secondary | ICD-10-CM | POA: Diagnosis present

## 2019-06-01 DIAGNOSIS — Y9389 Activity, other specified: Secondary | ICD-10-CM | POA: Diagnosis not present

## 2019-06-01 MED ORDER — METHOCARBAMOL 1000 MG/10ML IJ SOLN
500.0000 mg | Freq: Once | INTRAMUSCULAR | Status: AC
  Administered 2019-06-01: 500 mg via INTRAVENOUS
  Filled 2019-06-01: qty 5

## 2019-06-01 MED ORDER — SODIUM CHLORIDE 0.9 % IV SOLN
INTRAVENOUS | Status: DC | PRN
  Administered 2019-06-01: 1000 mL via INTRAVENOUS

## 2019-06-01 MED ORDER — ONDANSETRON HCL 4 MG/2ML IJ SOLN
4.0000 mg | Freq: Once | INTRAMUSCULAR | Status: AC
  Administered 2019-06-01: 4 mg via INTRAVENOUS
  Filled 2019-06-01: qty 2

## 2019-06-01 MED ORDER — PROPOFOL 10 MG/ML IV BOLUS
0.5000 mg/kg | Freq: Once | INTRAVENOUS | Status: DC
  Filled 2019-06-01: qty 20

## 2019-06-01 MED ORDER — KETAMINE HCL 10 MG/ML IJ SOLN
0.5000 mg/kg | Freq: Once | INTRAMUSCULAR | Status: DC
  Filled 2019-06-01: qty 1

## 2019-06-01 MED ORDER — PROPOFOL 10 MG/ML IV BOLUS
INTRAVENOUS | Status: DC | PRN
  Administered 2019-06-01: 40 mg via INTRAVENOUS

## 2019-06-01 MED ORDER — KETAMINE HCL 10 MG/ML IJ SOLN
INTRAMUSCULAR | Status: DC | PRN
  Administered 2019-06-01: 40 mg via INTRAVENOUS

## 2019-06-01 MED ORDER — FENTANYL CITRATE (PF) 100 MCG/2ML IJ SOLN
100.0000 ug | Freq: Once | INTRAMUSCULAR | Status: AC
  Administered 2019-06-01: 100 ug via INTRAVENOUS
  Filled 2019-06-01: qty 2

## 2019-06-01 NOTE — ED Provider Notes (Signed)
[facility] Emergency Department Provider Note  ____________________________________________   First MD Initiated Contact with Patient 06/01/19 1236     (approximate)  I have reviewed the triage vital signs and the nursing notes.  History  Chief Complaint Arm Pain    HPI Carl Walter is a 34 y.o. male with a history of seizures, recurrent shoulder dislocations, who presents to the emergency department for right-sided shoulder dislocation.  Injury occurred around 11 AM.  Patient states he was lifting/moving a heavy box at work and must have overextended/overstretched his arm. Felt a pop.  He denies any direct trauma, falls, or preceding seizures.  He is right-handed.  Denies any numbness or tingling.  Reports pain to the right shoulder, aching, 4/10 in severity, no radiation.  Alleviated with isolation/no movement, aggravated with movement.   Past Medical Hx Past Medical History:  Diagnosis Date  . Non Hodgkin's lymphoma ([place]) 1998  . Seizures ([place])     Problem List There are no problems to display for this patient.   Past Surgical Hx Past Surgical History:  Procedure Laterality Date  . EYE SURGERY Left 1998  . SHOULDER CLOSED REDUCTION Right 04/12/2019   Procedure: CLOSED REDUCTION SHOULDER;  Surgeon: Corky Mull, MD;  Location: [facility] ORS;  Service: Orthopedics;  Laterality: Right;  . WISDOM TOOTH EXTRACTION      Medications Prior to Admission medications   Medication Sig Start Date End Date Taking? Authorizing Provider  levETIRAcetam (KEPPRA) 500 MG tablet Take 1 tablet (500 mg total) by mouth 2 (two) times daily. 11/25/18   Lavonia Drafts, MD  montelukast (SINGULAIR) 10 MG tablet Take 10 mg by mouth daily as needed (allergy symptom).    [provider]    Allergies Patient has no known allergies.  Family Hx History reviewed. No pertinent family history.  Social Hx Social History   Tobacco Use  . Smoking status: Never Smoker   . Smokeless tobacco: Never Used  Substance Use Topics  . Alcohol use: Yes    Comment: once/week  . Drug use: No     Review of Systems  Constitutional: Negative for fever. Negative for chills. Eyes: Negative for visual changes. ENT: Negative for sore throat. Cardiovascular: Negative for chest pain. Respiratory: Negative for shortness of breath. Gastrointestinal: Negative for nausea. Negative for vomiting.  Genitourinary: Negative for dysuria. Musculoskeletal: Negative for leg swelling.  Positive for right shoulder dislocation. Skin: Negative for rash. Neurological: Negative for headaches.   Physical Exam  Vital Signs: ED Triage Vitals  Enc Vitals Group     BP 06/01/19 1114 129/66     Pulse Rate 06/01/19 1114 76     Resp --      Temp 06/01/19 1114 98.1 F (36.7 C)     Temp Source 06/01/19 1114 Oral     SpO2 06/01/19 1114 99 %     Weight 06/01/19 1115 175 lb (79.4 kg)     Height 06/01/19 1115 5\' 10"  (1.778 m)     Head Circumference --      Peak Flow --      Pain Score 06/01/19 1115 4     Pain Loc --      Pain Edu? --      Excl. in [place]? --     Constitutional: Alert and oriented. Well appearing. NAD.  Head: Normocephalic. Atraumatic. Eyes: Conjunctivae clear. Sclera anicteric. Pupils equal and symmetric. Nose: No masses or lesions. No congestion or rhinorrhea. Mouth/Throat: Wearing mask.  Neck: No  stridor. Trachea midline.  Cardiovascular: Normal rate, regular rhythm. Extremities well perfused.  2+ right radial pulse.  Fingers warm well perfused.  Cap refill less than 2 seconds. Respiratory: Normal respiratory effort.  Genitourinary: Deferred. Musculoskeletal: Obvious deformity, anterior dislocation to the right shoulder.  Distally neurovascularly intact, with 2+ radial pulse by palpation, cap refill less than 2 seconds, fingers warm and well perfused.  Motor/sensation intact in radial, median, ulnar distribution. Neurologic:  Normal speech and language. No gross  focal or lateralizing neurologic deficits are appreciated.  Skin: Skin is warm, dry and intact. No rash noted. Psychiatric: Mood and affect are appropriate for situation.   Radiology  Personally reviewed available imaging myself.   XR right shoulder - IMPRESSION:  RIGHT glenohumeral dislocation, anterior dislocation. No obvious  fracture. Attention on post reduction views. Glenohumeral joint with  similarly dislocated on 04/12/2019   XR post reduction - IMPRESSION:  Successful reduction of previously described anterior right shoulder  dislocation.    Procedures  Procedure(s) performed (including critical care):  .Ortho Injury Treatment  Date/Time: 06/01/2019 9:12 PM Performed by: Lilia Pro., MD Authorized by: Lilia Pro., MD   Consent:    Consent obtained:  Verbal   Consent given by:  Patient   Risks discussed:  Irreducible dislocation, restricted joint movement, stiffness and fractureInjury location: shoulder Location details: right shoulder Injury type: dislocation Dislocation type: anterior Chronicity: recurrent Pre-procedure neurovascular assessment: neurovascularly intact Pre-procedure range of motion: reduced  Patient sedated: Yes. Refer to sedation procedure documentation for details of sedation. Manipulation performed: yes Reduction method: traction and counter traction and scapular manipulation (fulcrum created with fist under axilla) X-ray confirmed reduction: yes Immobilization: shoulder immobilizer. Post-procedure neurovascular assessment: post-procedure neurovascularly intact Post-procedure distal perfusion: normal Post-procedure neurological function: normal Post-procedure range of motion: improved Patient tolerance: patient tolerated the procedure well with no immediate complications      Initial Impression / Assessment and Plan / MDM / ED Course  34 y.o. male who presents to the ED for recurrent R shoulder dislocation, confirmed on XR.     Will initially plan for IV pain/muscle relaxer and attempt reduction. Patient in agreement.   Clinical Course as of May 31 2109  Thu Jun 01, 2019  1523 Unable to reduce shoulder with IV dose of fentanyl and Robaxin.  Patient consented for moderate sedation. NPO 8-9 AM.    [SM]  1628 Successful reduction of shoulder w/ procedural sedation (by Dr. Jari Pigg). Reduction assistance w/ Dr. Charna Archer.  Patient tolerated well w/o adverse events. Will plan for shoulder immobilizer, repeat XR, and plan for d/c with ortho f/u after period of observation s/p sedation.   [SM]  1659 XR reveals successful reduction. No fracture.   [SM]  1723 Patient continues to be well after his sedation, monitored ~1 hour.  We will by mouth challenge and as long as he tolerates it well we will plan for discharge with orthopedic follow-up.   [SM]  1820 Tolerated PO. Will proceed w/ discharge as planned.   [SM]    Clinical Course User Index [SM] Lilia Pro., MD     _______________________________   As part of my medical decision making I have reviewed available labs, radiology tests, reviewed old records/performed chart review.    Final Clinical Impression(s) / ED Diagnosis  Final diagnoses:  Recurrent dislocation of right shoulder       Note:  This document was prepared using Dragon voice recognition software and may include unintentional dictation errors.   Fantasy Donald,  Damaris Hippo., MD 06/01/19 2122

## 2019-06-01 NOTE — ED Notes (Signed)
Pt back to baseline after sedation medications

## 2019-06-01 NOTE — ED Notes (Signed)
R shoulder sling placed

## 2019-06-01 NOTE — Discharge Instructions (Addendum)
Thank you for letting us take care of you in the emergency department today.   Please continue to take any regular, prescribed medications. You can take over the counter ibuprofen/Tylenol as needed for pain/discomfort.   You received procedural sedation today. For the rest of today, do not drive or operate heavy machinery. Avoid alcohol or other sedating medications.   Please follow up with: Orthopedic doctor, information as below   Until you are seen by the orthopedic doctor, limited heavy lifting of your right arm. You can continue to do range of motion movements of your elbow and wrist.   Please return to the ER for any new or worsening symptoms.

## 2019-06-01 NOTE — ED Notes (Signed)
Robaxin given to assist with manipulation by EDP. EDP remains at bedside at this time. Pt tolerating procedure well.

## 2019-06-01 NOTE — ED Triage Notes (Signed)
Pt from home via POV. Per pt "dislocated right arm at work" works at [employer].. Helping a coworker and lift something heavy an I heard a  "pop". Pt st he have dislocated his right arm before for the same reason. No deformity noted a thi time. NAD.

## 2019-06-01 NOTE — ED Notes (Signed)
Per EDP hold Fentanyl until able to give Robaxin and Fentanyl together.

## 2019-06-01 NOTE — ED Notes (Signed)
EDP unable to reduce R shoulder dislocation at this time.

## 2019-06-01 NOTE — ED Notes (Signed)
EDP at bedside at this time, pain medication administered to patient in preparation for attempt to reduce R shoulder dislocation.

## 2019-06-01 NOTE — ED Provider Notes (Signed)
   [facility] Emergency Department Provider Note  ____________________________________________   First MD Initiated Contact with Patient 06/01/19 1236     (approximate)  I have reviewed the triage vital signs and the nursing notes.   HISTORY  Chief Complaint Arm Pain    PROCEDURES  Procedure(s) performed (including Critical Care):  .Sedation  Date/Time: 06/01/2019 7:32 PM Performed by: Vanessa [place], MD Authorized by: Vanessa Laurel, MD   Consent:    Consent obtained:  Verbal   Consent given by:  Patient   Risks discussed:  Allergic reaction, dysrhythmia, inadequate sedation, nausea, prolonged hypoxia resulting in organ damage, prolonged sedation necessitating reversal, respiratory compromise necessitating ventilatory assistance and intubation and vomiting   Alternatives discussed:  Analgesia without sedation, anxiolysis and regional anesthesia Universal protocol:    Procedure explained and questions answered to patient or proxy's satisfaction: yes     Relevant documents present and verified: yes     Test results available and properly labeled: yes     Imaging studies available: yes     Required blood products, implants, devices, and special equipment available: yes     Site/side marked: yes     Immediately prior to procedure a time out was called: yes     Patient identity confirmation method:  Verbally with patient Indications:    Procedure necessitating sedation performed by:  Physician performing sedation Pre-sedation assessment:    Time since last food or drink:  8   ASA classification: class 1 - normal, healthy patient     Neck mobility: normal     Mouth opening:  3 or more finger widths   Thyromental distance:  4 finger widths   Mallampati score:  I - soft palate, uvula, fauces, pillars visible   Pre-sedation assessments completed and reviewed: airway patency, cardiovascular function, hydration status, mental status, nausea/vomiting, pain  level, respiratory function and temperature     Pre-sedation assessment completed:  06/01/2019 4:45 PM Immediate pre-procedure details:    Reassessment: Patient reassessed immediately prior to procedure     Reviewed: vital signs, relevant labs/tests and NPO status     Verified: bag valve mask available, emergency equipment available, intubation equipment available, IV patency confirmed, oxygen available and suction available   Procedure details (see MAR for exact dosages):    Preoxygenation:  Nasal cannula   Sedation:  Propofol and ketamine   Intended level of sedation: deep   Intra-procedure monitoring:  Blood pressure monitoring, cardiac monitor, continuous pulse oximetry, frequent LOC assessments, frequent vital sign checks and continuous capnometry   Intra-procedure events: none     Total Provider sedation time (minutes):  15 Post-procedure details:    Attendance: Constant attendance by certified staff until patient recovered     Recovery: Patient returned to pre-procedure baseline     Post-sedation assessments completed and reviewed: airway patency, cardiovascular function, hydration status, mental status, nausea/vomiting, pain level, respiratory function and temperature     Patient is stable for discharge or admission: yes     Patient tolerance:  Tolerated well, no immediate complications     ____________________________________________    Vanessa , MD 06/01/19 [phone]

## 2019-06-01 NOTE — ED Notes (Signed)
Pt's shoulder back in place, procedure over. Pt tolerated well.

## 2019-06-01 NOTE — ED Notes (Signed)
Report called to M.Jones RN  Pt moved to room 13

## 2019-06-01 NOTE — ED Notes (Signed)
See triage note  Presents wit pain to right shoulder   States he was helping someone move a box   Felt a pop  States he has hx of shoulder dislocations in past  Good pulses

## 2020-07-30 ENCOUNTER — Other Ambulatory Visit: Payer: Self-pay | Admitting: Orthopedic Surgery

## 2020-07-30 DIAGNOSIS — S43014D Anterior dislocation of right humerus, subsequent encounter: Secondary | ICD-10-CM

## 2020-08-12 ENCOUNTER — Other Ambulatory Visit: Payer: Self-pay | Admitting: Orthopedic Surgery

## 2020-08-12 DIAGNOSIS — S43014D Anterior dislocation of right humerus, subsequent encounter: Secondary | ICD-10-CM

## 2020-08-13 ENCOUNTER — Ambulatory Visit
Admission: RE | Admit: 2020-08-13 | Discharge: 2020-08-13 | Disposition: A | Discharge status: Home / Self Care | Payer: Managed Care, Other (non HMO) | Source: Ambulatory Visit | Attending: Orthopedic Surgery | Admitting: Orthopedic Surgery

## 2020-08-13 ENCOUNTER — Other Ambulatory Visit: Payer: Self-pay

## 2020-08-13 DIAGNOSIS — S43014D Anterior dislocation of right humerus, subsequent encounter: Secondary | ICD-10-CM

## 2021-03-22 ENCOUNTER — Observation Stay: Payer: Managed Care, Other (non HMO)

## 2021-03-22 ENCOUNTER — Observation Stay
Admission: EM | Admit: 2021-03-22 | Discharge: 2021-03-22 | Disposition: A | Discharge status: Home / Self Care | Payer: Managed Care, Other (non HMO) | Attending: Orthopedic Surgery | Admitting: Orthopedic Surgery

## 2021-03-22 ENCOUNTER — Emergency Department: Payer: Managed Care, Other (non HMO)

## 2021-03-22 ENCOUNTER — Observation Stay: Payer: Managed Care, Other (non HMO) | Admitting: Anesthesiology

## 2021-03-22 ENCOUNTER — Other Ambulatory Visit: Payer: Self-pay

## 2021-03-22 ENCOUNTER — Encounter
Admission: EM | Disposition: A | Discharge status: Home / Self Care | Payer: Self-pay | Source: Home / Self Care | Attending: Emergency Medicine

## 2021-03-22 DIAGNOSIS — X501XXA Overexertion from prolonged static or awkward postures, initial encounter: Secondary | ICD-10-CM | POA: Diagnosis not present

## 2021-03-22 DIAGNOSIS — S43014A Anterior dislocation of right humerus, initial encounter: Principal | ICD-10-CM | POA: Diagnosis present

## 2021-03-22 DIAGNOSIS — Z20822 Contact with and (suspected) exposure to covid-19: Secondary | ICD-10-CM | POA: Diagnosis not present

## 2021-03-22 DIAGNOSIS — Y990 Civilian activity done for income or pay: Secondary | ICD-10-CM | POA: Diagnosis not present

## 2021-03-22 DIAGNOSIS — Z419 Encounter for procedure for purposes other than remedying health state, unspecified: Secondary | ICD-10-CM

## 2021-03-22 HISTORY — PX: CLOSED REDUCTION WRIST FRACTURE: SHX1091

## 2021-03-22 LAB — CBC
HCT: 42.7 % (ref 39.0–52.0)
Hemoglobin: 14.2 g/dL (ref 13.0–17.0)
MCH: 30.7 pg (ref 26.0–34.0)
MCHC: 33.3 g/dL (ref 30.0–36.0)
MCV: 92.4 fL (ref 80.0–100.0)
Platelets: 239 10*3/uL (ref 150–400)
RBC: 4.62 MIL/uL (ref 4.22–5.81)
RDW: 12.2 % (ref 11.5–15.5)
WBC: 6.8 10*3/uL (ref 4.0–10.5)
nRBC: 0 % (ref 0.0–0.2)

## 2021-03-22 LAB — COMPREHENSIVE METABOLIC PANEL
ALT: 16 U/L (ref 0–44)
AST: 18 U/L (ref 15–41)
Albumin: 4 g/dL (ref 3.5–5.0)
Alkaline Phosphatase: 41 U/L (ref 38–126)
Anion gap: 7 (ref 5–15)
BUN: 16 mg/dL (ref 6–20)
CO2: 24 mmol/L (ref 22–32)
Calcium: 8.6 mg/dL — ABNORMAL LOW (ref 8.9–10.3)
Chloride: 104 mmol/L (ref 98–111)
Creatinine, Ser: 1.11 mg/dL (ref 0.61–1.24)
GFR, Estimated: >60 mL/min (ref >60)
Glucose, Bld: 111 mg/dL — ABNORMAL HIGH (ref 70–99)
Potassium: 3.9 mmol/L (ref 3.5–5.1)
Sodium: 135 mmol/L (ref 135–145)
Total Bilirubin: 0.8 mg/dL (ref 0.3–1.2)
Total Protein: 7.4 g/dL (ref 6.5–8.1)

## 2021-03-22 LAB — TYPE AND SCREEN
ABO/RH(D): A POS
Antibody Screen: NEGATIVE

## 2021-03-22 LAB — RESP PANEL BY RT-PCR (FLU A&B, COVID) ARPGX2
Influenza A by PCR: NEGATIVE
Influenza B by PCR: NEGATIVE
SARS Coronavirus 2 by RT PCR: NEGATIVE

## 2021-03-22 SURGERY — CLOSED REDUCTION, WRIST
Anesthesia: General | Site: Shoulder | Laterality: Right

## 2021-03-22 MED ORDER — SODIUM CHLORIDE 0.9 % IV SOLN: INTRAVENOUS | Status: DC

## 2021-03-22 MED ORDER — MIDAZOLAM HCL 2 MG/2ML IJ SOLN
INTRAMUSCULAR | Status: AC
  Filled 2021-03-22: qty 4

## 2021-03-22 MED ORDER — ACETAMINOPHEN 10 MG/ML IV SOLN: 1000.0000 mg | Freq: Once | INTRAVENOUS | Status: DC | PRN

## 2021-03-22 MED ORDER — FENTANYL CITRATE (PF) 100 MCG/2ML IJ SOLN
INTRAMUSCULAR | Status: AC | PRN
  Administered 2021-03-22: 100 ug via INTRAVENOUS

## 2021-03-22 MED ORDER — FENTANYL CITRATE (PF) 100 MCG/2ML IJ SOLN: 25.0000 ug | INTRAMUSCULAR | Status: DC | PRN

## 2021-03-22 MED ORDER — HYDROMORPHONE HCL 1 MG/ML IJ SOLN
1.0000 mg | Freq: Once | INTRAMUSCULAR | Status: AC
  Administered 2021-03-22: 1 mg via INTRAVENOUS
  Filled 2021-03-22: qty 1

## 2021-03-22 MED ORDER — ONDANSETRON HCL 4 MG/2ML IJ SOLN
4.0000 mg | Freq: Four times a day (QID) | INTRAMUSCULAR | Status: DC | PRN
Start: 2021-03-22 — End: 2021-03-23

## 2021-03-22 MED ORDER — LIDOCAINE HCL (PF) 2 % IJ SOLN
INTRAMUSCULAR | Status: AC
  Filled 2021-03-22: qty 5

## 2021-03-22 MED ORDER — MORPHINE SULFATE (PF) 2 MG/ML IV SOLN: 2.0000 mg | INTRAVENOUS | Status: DC | PRN

## 2021-03-22 MED ORDER — OXYCODONE HCL 5 MG PO TABS: 5.0000 mg | ORAL_TABLET | Freq: Once | ORAL | Status: DC | PRN

## 2021-03-22 MED ORDER — PROPOFOL 10 MG/ML IV BOLUS
INTRAVENOUS | Status: DC | PRN
  Administered 2021-03-22: 100 mg via INTRAVENOUS
  Administered 2021-03-22: 30 mg via INTRAVENOUS

## 2021-03-22 MED ORDER — LACTATED RINGERS IV SOLN: INTRAVENOUS | Status: DC | PRN

## 2021-03-22 MED ORDER — PROMETHAZINE HCL 25 MG/ML IJ SOLN: 6.2500 mg | INTRAMUSCULAR | Status: DC | PRN

## 2021-03-22 MED ORDER — LIDOCAINE HCL (CARDIAC) PF 100 MG/5ML IV SOSY
PREFILLED_SYRINGE | INTRAVENOUS | Status: DC | PRN
  Administered 2021-03-22: 80 mg via INTRAVENOUS

## 2021-03-22 MED ORDER — ONDANSETRON HCL 4 MG PO TABS
4.0000 mg | ORAL_TABLET | Freq: Four times a day (QID) | ORAL | Status: DC | PRN
Start: 2021-03-22 — End: 2021-03-23

## 2021-03-22 MED ORDER — ETOMIDATE 2 MG/ML IV SOLN
10.0000 mg | Freq: Once | INTRAVENOUS | Status: DC
  Filled 2021-03-22: qty 10

## 2021-03-22 MED ORDER — OXYCODONE HCL 5 MG/5ML PO SOLN: 5.0000 mg | Freq: Once | ORAL | Status: DC | PRN

## 2021-03-22 MED ORDER — PROPOFOL 10 MG/ML IV BOLUS
INTRAVENOUS | Status: AC | PRN
  Administered 2021-03-22: 40 mg via INTRAVENOUS

## 2021-03-22 MED ORDER — METOCLOPRAMIDE HCL 5 MG/ML IJ SOLN: 5.0000 mg | Freq: Three times a day (TID) | INTRAMUSCULAR | Status: DC | PRN

## 2021-03-22 MED ORDER — METOCLOPRAMIDE HCL 10 MG PO TABS: 5.0000 mg | ORAL_TABLET | Freq: Three times a day (TID) | ORAL | Status: DC | PRN

## 2021-03-22 MED ORDER — MIDAZOLAM HCL 2 MG/2ML IJ SOLN
INTRAMUSCULAR | Status: AC | PRN
  Administered 2021-03-22 (×2): 2 mg via INTRAVENOUS

## 2021-03-22 MED ORDER — ONDANSETRON HCL 4 MG/2ML IJ SOLN
4.0000 mg | Freq: Once | INTRAMUSCULAR | Status: AC
  Administered 2021-03-22: 4 mg via INTRAVENOUS
  Filled 2021-03-22: qty 2

## 2021-03-22 MED ORDER — PROPOFOL 1000 MG/100ML IV EMUL
INTRAVENOUS | Status: AC
  Filled 2021-03-22: qty 100

## 2021-03-22 MED ORDER — PROPOFOL 10 MG/ML IV BOLUS
INTRAVENOUS | Status: AC
  Filled 2021-03-22: qty 20

## 2021-03-22 MED ORDER — ETOMIDATE 2 MG/ML IV SOLN
INTRAVENOUS | Status: AC | PRN
  Administered 2021-03-22 (×2): 10 mg via INTRAVENOUS

## 2021-03-22 MED ORDER — FENTANYL CITRATE PF 50 MCG/ML IJ SOSY
PREFILLED_SYRINGE | INTRAMUSCULAR | Status: AC
  Filled 2021-03-22: qty 2

## 2021-03-22 SURGICAL SUPPLY — 22 items
BNDG ELASTIC 4X5.8 VLCR STR LF (GAUZE/BANDAGES/DRESSINGS) ×2 IMPLANT
CHLORAPREP W/TINT 26 (MISCELLANEOUS) ×2 IMPLANT
DRAPE FLUOR MINI C-ARM 54X84 (DRAPES) ×2 IMPLANT
DRAPE SURG 17X11 SM STRL (DRAPES) ×2 IMPLANT
DRAPE U-SHAPE 47X51 STRL (DRAPES) ×2 IMPLANT
GAUZE SPONGE 4X4 12PLY STRL (GAUZE/BANDAGES/DRESSINGS) ×2 IMPLANT
GAUZE XEROFORM 1X8 LF (GAUZE/BANDAGES/DRESSINGS) ×2 IMPLANT
GLOVE SURG SYN 9.0  PF PI (GLOVE) ×1
GLOVE SURG SYN 9.0 PF PI (GLOVE) ×1 IMPLANT
GLOVE SURG UNDER POLY LF SZ9 (GLOVE) ×2 IMPLANT
GOWN SRG 2XL LVL 4 RGLN SLV (GOWNS) ×1 IMPLANT
GOWN STRL NON-REIN 2XL LVL4 (GOWNS) ×1
GOWN STRL REUS W/ TWL LRG LVL3 (GOWN DISPOSABLE) ×1 IMPLANT
GOWN STRL REUS W/TWL LRG LVL3 (GOWN DISPOSABLE) ×1
KIT TURNOVER KIT A (KITS) ×2 IMPLANT
MANIFOLD NEPTUNE II (INSTRUMENTS) ×2 IMPLANT
PACK EXTREMITY ARMC (MISCELLANEOUS) ×2 IMPLANT
PAD CAST CTTN 4X4 STRL (SOFTGOODS) ×2 IMPLANT
PADDING CAST COTTON 4X4 STRL (SOFTGOODS) ×2
SCALPEL PROTECTED #15 DISP (BLADE) ×4 IMPLANT
SPLINT CAST 1 STEP 3X12 (MISCELLANEOUS) ×2 IMPLANT
WATER STERILE IRR 500ML POUR (IV SOLUTION) ×2 IMPLANT

## 2021-03-22 NOTE — Discharge Instructions (Addendum)
Keep arm in sling is much as possible for 1 week Tylenol or Advil as needed for pain Follow-up in a week to discuss possible surgical repair.   AMBULATORY SURGERY  DISCHARGE INSTRUCTIONS   The drugs that you were given will stay in your system until tomorrow so for the next 24 hours you should not:  Drive an automobile Make any legal decisions Drink any alcoholic beverage   You may resume regular meals tomorrow.  Today it is better to start with liquids and gradually work up to solid foods.  You may eat anything you prefer, but it is better to start with liquids, then soup and crackers, and gradually work up to solid foods.   Please notify your doctor immediately if you have any unusual bleeding, trouble breathing, redness and pain at the surgery site, drainage, fever, or pain not relieved by medication.     Additional Instructions:

## 2021-03-22 NOTE — Anesthesia Postprocedure Evaluation (Addendum)
Anesthesia Post Note  Patient: Carl Walter  Procedure(s) Performed: CLOSED REDUCTION SHOULDER (Right: Shoulder)  Patient location during evaluation: PACU Anesthesia Type: MAC Level of consciousness: awake and alert Pain management: pain level controlled Vital Signs Assessment: post-procedure vital signs reviewed and stable Respiratory status: spontaneous breathing Cardiovascular status: stable Anesthetic complications: no   No notable events documented.   Last Vitals:  Vitals:   03/22/21 1630 03/22/21 1645  BP: 121/65 117/71  Pulse: 84 84  Resp:    Temp:    SpO2: 100% 98%    Last Pain:  Vitals:   03/22/21 1140  TempSrc: Oral  PainSc:                  Claiborne Billings Herringdine

## 2021-03-22 NOTE — ED Notes (Signed)
Report given to OR RN over the phone.

## 2021-03-22 NOTE — Transfer of Care (Signed)
Immediate Anesthesia Transfer of Care Note  Patient: Carl Walter  Procedure(s) Performed: CLOSED REDUCTION SHOULDER (Right: Shoulder)  Patient Location: PACU  Anesthesia Type:MAC  Level of Consciousness: awake  Airway & Oxygen Therapy: Patient Spontanous Breathing  Post-op Assessment: Report given to RN  Post vital signs: stable  Last Vitals:  Vitals Value Taken Time  BP 123/73 03/22/21 1945  Temp    Pulse 82 03/22/21 1946  Resp 15 03/22/21 1946  SpO2 98 % 03/22/21 1946  Vitals shown include unvalidated device data.  Last Pain:  Vitals:   03/22/21 1140  TempSrc: Oral  PainSc:          Complications: No notable events documented.

## 2021-03-22 NOTE — Sedation Documentation (Signed)
Etomidate 10 mg given IVP

## 2021-03-22 NOTE — ED Notes (Signed)
Pt states he dislocated his R shoulder while getting dressed. He states this has happened multiple times in the past. Pt states that they have never gotten the shoulder back in place without putting him under general anesthesia.

## 2021-03-22 NOTE — ED Notes (Signed)
Pt ambulated without assist from xray to pt room.

## 2021-03-22 NOTE — ED Notes (Signed)
Shoulder unable to be reduced with IV pain Medication

## 2021-03-22 NOTE — Anesthesia Preprocedure Evaluation (Addendum)
Anesthesia Evaluation  Patient identified by MRN, date of birth, ID band Patient awake    Reviewed: Allergy & Precautions, NPO status , Patient's Chart, lab work & pertinent test results  Airway Mallampati: II  TM Distance: >3 FB Neck ROM: full    Dental no notable dental hx.    Pulmonary neg pulmonary ROS,    Pulmonary exam normal        Cardiovascular negative cardio ROS Normal cardiovascular exam     Neuro/Psych Seizures -,  negative psych ROS   GI/Hepatic negative GI ROS, Neg liver ROS,   Endo/Other  negative endocrine ROS  Renal/GU negative Renal ROS  negative genitourinary   Musculoskeletal   Abdominal Normal abdominal exam  (+)   Peds  Hematology negative hematology ROS (+)   Anesthesia Other Findings Right shoulder dislocation s/p failed attempt at reduction in the ED with deep sedation.    Past Medical History: 1998: Non Hodgkin's lymphoma ([place]) No date: Seizures ([place])  Past Surgical History: 1998: EYE SURGERY; Left 04/12/2019: SHOULDER CLOSED REDUCTION; Right     Comment:  Procedure: CLOSED REDUCTION SHOULDER;  Surgeon: Corky Mull, MD;  Location: [facility] ORS;  Service: Orthopedics;                Laterality: Right; No date: WISDOM TOOTH EXTRACTION  BMI    Body Mass Index: 23.68 kg/m      Reproductive/Obstetrics negative OB ROS                            Anesthesia Physical Anesthesia Plan  ASA: 2  Anesthesia Plan: General   Post-op Pain Management:    Induction: Intravenous  PONV Risk Score and Plan: Propofol infusion and TIVA  Airway Management Planned: Natural Airway and Nasal Cannula  Additional Equipment:   Intra-op Plan:   Post-operative Plan:   Informed Consent: I have reviewed the patients History and Physical, chart, labs and discussed the procedure including the risks, benefits and alternatives for the proposed anesthesia with the  patient or authorized representative who has indicated his/her understanding and acceptance.     Dental advisory given  Plan Discussed with: Anesthesiologist, CRNA and Surgeon  Anesthesia Plan Comments:        Anesthesia Quick Evaluation

## 2021-03-22 NOTE — ED Triage Notes (Signed)
Pt states that he was putting on his work uniform and his R shoulder became dislocated- pt states this has happened multiple times in the past and usually requires sedation to get it back in

## 2021-03-22 NOTE — ED Notes (Signed)
SpO2 decreased to 2 liter via [place].

## 2021-03-22 NOTE — ED Notes (Signed)
Secure msg sent to Linward Foster, RN for ED to IP SBAR.

## 2021-03-22 NOTE — H&P (Addendum)
Subjective:   Patient is a 36 y.o. male presents with right shoulder pain.  He reports 12 prior shoulder dislocations.  Today he was getting his lab coat on while at work and the shoulder popped out.  He works as a Quarry manager at [employer].  He came to emergency room and attempted reduction under a great deal of sedation was unsuccessful.  He reports he has prior previously needed general anesthesia for reduction.  He reports no numbness down the arm no prior surgery other than close reduction on the arm.  There are no problems to display for this patient.  Past Medical History:  Diagnosis Date   Non Hodgkin's lymphoma ([place]) 1998   Seizures ([place])     Past Surgical History:  Procedure Laterality Date   EYE SURGERY Left 1998   SHOULDER CLOSED REDUCTION Right 04/12/2019   Procedure: CLOSED REDUCTION SHOULDER;  Surgeon: Corky Mull, MD;  Location: [facility] ORS;  Service: Orthopedics;  Laterality: Right;   WISDOM TOOTH EXTRACTION      (Not in a hospital admission)  No Known Allergies  Social History   Tobacco Use   Smoking status: Never   Smokeless tobacco: Never  Substance Use Topics   Alcohol use: Yes    Comment: once/week    No family history on file.  Review of Systems Pertinent items are noted in HPI.  Objective:   Patient Vitals for the past 8 hrs:  BP Temp Temp src Pulse Resp SpO2 Height Weight  03/22/21 1500 131/77 -- -- 96 13 97 % -- --  03/22/21 1450 112/70 -- -- 78 10 97 % -- --  03/22/21 1446 122/74 -- -- 82 14 95 % -- --  03/22/21 1445 122/74 -- -- 97 14 94 % -- --  03/22/21 1440 126/65 -- -- 98 12 98 % -- --  03/22/21 1436 136/69 -- -- (!) 108 10 93 % -- --  03/22/21 1432 (!) 119/101 -- -- (!) 102 18 98 % -- --  03/22/21 1428 (!) 152/73 -- -- 99 12 94 % -- --  03/22/21 1420 (!) 149/83 -- -- 97 18 98 % -- --  03/22/21 1358 139/72 -- -- 93 18 98 % -- --  03/22/21 1140 133/81 98.8 F (37.1 C) Oral 97 20 98 % -- --  03/22/21 1139 -- -- -- -- -- -- 5\' 10"  (1.778 m) 74.8  kg   No intake/output data recorded. No intake/output data recorded.  HEENT is unremarkable Heart and lungs normal Right arm is externally rotated and abducted with palpable humeral head anterior shoulder and defect posteriorly consistent with dislocation.  Neurovascular intact right upper extremity  Data ReviewRadiology review: Dislocated anterior shoulder with large Hill-Sachs lesion  Assessment:   Right shoulder anterior dislocation recurrent   Plan:   Impression is dislocated shoulder with failed closed reduction under IV sedation by ER staff plan is for close reduction under general anesthesia in the operating room with fluoroscopy

## 2021-03-22 NOTE — Sedation Documentation (Signed)
Pt SpO2 down to 89%, pt placed on 2 Liter via [place] with SpO2 returning back to 95%

## 2021-03-22 NOTE — Sedation Documentation (Signed)
Pt Spo2 dropped to 86% on 2 liters. O2 increased to 6 liters. Pt SpO2 up to 98%

## 2021-03-22 NOTE — ED Notes (Signed)
MD at bedside attempting to reduce pts shoulder with dose of IV pain medication.

## 2021-03-22 NOTE — Op Note (Signed)
03/22/2021  7:46 PM  PATIENT:  [place]  36 y.o. male  PRE-OPERATIVE DIAGNOSIS:  Right shoulder dislocation anterior  POST-OPERATIVE DIAGNOSIS:  Right shoulder dislocation anterior  PROCEDURE:  Procedure(s): CLOSED REDUCTION SHOULDER (Right)  SURGEON: Laurene Footman, MD  ASSISTANTS: None  ANESTHESIA:   general  EBL:  Total I/O In: 50 [I.V.:50] Out: -   BLOOD ADMINISTERED:none  DRAINS: none   LOCAL MEDICATIONS USED:  NONE  SPECIMEN:  No Specimen  DISPOSITION OF SPECIMEN:  N/A  COUNTS:  NO closed procedure no count required  TOURNIQUET:  * Missing tourniquet times found for documented tourniquets in log: [id number] *  IMPLANTS: None  DICTATION: .Dragon Dictation patient was brought to the operating room placed on the operative table.  After general anesthesia was obtained and appropriate patient identification and timeout procedures were completed the arm was brought up into flexion slight abduction and a great deal of traction applied initially there was no reduction but after second attempt the shoulder slid into place and could be internally rotated.  C-arm was brought in and showed anatomic reduction.  A sling was applied and the patient woken up and sent to recovery room in stable condition  PLAN OF CARE: Discharge to home after PACU  PATIENT DISPOSITION:  PACU - hemodynamically stable.

## 2021-03-22 NOTE — Sedation Documentation (Signed)
Shoulder not able to be reduced under moderate sedation. EPD will contact ortho to take pt to OR.

## 2021-03-22 NOTE — ED Provider Notes (Signed)
[facility] Provider Note    Event Date/Time   First MD Initiated Contact with Patient 03/22/21 1216     (approximate)  History   Chief Complaint: Shoulder Pain  HPI  Carl Walter is a 36 y.o. male with a past medical history of seizure disorder, recurrent shoulder dislocation, presents to the emergency department for right shoulder dislocation.  According to the patient who was at work he attempted to put his lab coat on and in the process dislocated his right shoulder.  Patient states pain to the right shoulder.  Patient states this is the 12th dislocation of the right shoulder.  Patient denies any injuries denies any trauma.  Physical Exam   Triage Vital Signs: ED Triage Vitals  Enc Vitals Group     BP 03/22/21 1140 133/81     Pulse Rate 03/22/21 1140 97     Resp 03/22/21 1140 20     Temp 03/22/21 1140 98.8 F (37.1 C)     Temp Source 03/22/21 1140 Oral     SpO2 03/22/21 1140 98 %     Weight 03/22/21 1139 165 lb (74.8 kg)     Height 03/22/21 1139 5\' 10"  (1.778 m)     Head Circumference --      Peak Flow --      Pain Score 03/22/21 1138 6     Pain Loc --      Pain Edu? --      Excl. in [place]? --     Most recent vital signs: Vitals:   03/22/21 1140 03/22/21 1358  BP: 133/81 139/72  Pulse: 97 93  Resp: 20 18  Temp: 98.8 F (37.1 C)   SpO2: 98% 98%    General: Awake, no distress.  CV:  Good peripheral perfusion.  Regular rate and rhythm  Resp:  Normal effort.  Equal breath sounds bilaterally.  Abd:  No distention.  Soft, nontender.  No rebound or guarding. Other:  Patient has deformity to the right shoulder with pain to the right shoulder with any movement of the right upper extremity.  Neurovascular intact.   ED Results / Procedures / Treatments    RADIOLOGY  I personally viewed the x-ray images appears to have a right shoulder dislocation. Radiology is read the x-ray as a right humeral head anterior dislocation.   MEDICATIONS  ORDERED IN ED: Medications  etomidate (AMIDATE) injection 10 mg (has no administration in time range)  HYDROmorphone (DILAUDID) injection 1 mg (1 mg Intravenous Given 03/22/21 1357)  ondansetron (ZOFRAN) injection 4 mg (4 mg Intravenous Given 03/22/21 1357)     IMPRESSION / MDM / ASSESSMENT AND PLAN / ED COURSE  I reviewed the triage vital signs and the nursing notes.  Patient presents to the emergency department with a right-sided shoulder dislocation.  This is the patient's 12th dislocation.  Attempted to reduce without success as patient cannot tolerate much movement without significant pain and tensing of the muscles.  Dose Dilaudid and Zofran and again attempted reduction but patient can still not tolerate much movement without tensing his muscles due to pain.  Patient states he has had sedation multiple times in the past for the same.  We will proceed with a moderate sedation with etomidate.  Performed moderate sedation for shoulder reduction.  Patient received Dilaudid and Zofran prior to sedation.  Patient received etomidate 20 mg, remained awake with tense muscles, able to answer questions and follow commands after 20 mg of etomidate.  Patient was  given 100 mcg of fentanyl and 2 mg of Versed.  After the fentanyl and Versed patient continued to be awake, attempted reduction again but unsuccessful as the patient continued to have very tense muscles fighting against the reduction.  I then dosed 40 mg of propofol.  Patient continued to remain awake fighting against reduction.  Answering questions.  Patient would desat to the upper 80s after these medications if he was not actively fighting the reduction.  Given multiple attempts with multiple rounds of sedation without success I discussed the patient with Dr. Rudene Christians of orthopedics who will take the patient to the OR for attempted reduction in the operating room.  I spoke to the patient's family who is agreeable to this plan.   Patient is somnolent,  but awakens to voice and answers questions appropriately.   .Sedation  Date/Time: 03/22/2021 3:23 PM Performed by: Harvest Dark, MD Authorized by: Harvest Dark, MD   Consent:    Consent obtained:  Verbal and written   Consent given by:  Patient   Risks discussed:  Prolonged hypoxia resulting in organ damage, prolonged sedation necessitating reversal, respiratory compromise necessitating ventilatory assistance and intubation, vomiting and nausea   Alternatives discussed:  Analgesia without sedation Universal protocol:    Procedure explained and questions answered to patient or proxy's satisfaction: yes     Relevant documents present and verified: yes     Imaging studies available: yes     Immediately prior to procedure, a time out was called: yes     Patient identity confirmed:  Arm band, verbally with patient and hospital-assigned identification number Indications:    Sedation is required to allow for: shoulder joint reduction.   Procedure necessitating sedation performed by:  Physician performing sedation Pre-sedation assessment:    Time since last food or drink:  3 hours   NPO status caution: urgency dictates proceeding with non-ideal NPO status     ASA classification: class 1 - normal, healthy patient     Mouth opening:  3 or more finger widths   Mallampati score:  I - soft palate, uvula, fauces, pillars visible   Neck mobility: normal     Pre-sedation assessments completed and reviewed: airway patency, cardiovascular function, mental status, nausea/vomiting, pain level and respiratory function     Pre-sedation assessment completed:  03/22/2021 2:30 PM Immediate pre-procedure details:    Reassessment: Patient reassessed immediately prior to procedure     Reviewed: vital signs     Verified: bag valve mask available, intubation equipment available and oxygen available   Procedure details (see MAR for exact dosages):    Preoxygenation:  Nasal cannula   Sedation:   Etomidate and propofol   Intended level of sedation: deep   Analgesia:  Fentanyl and hydromorphone   Intra-procedure monitoring:  Blood pressure monitoring, continuous capnometry, frequent LOC assessments, continuous pulse oximetry, cardiac monitor and frequent vital sign checks   Intra-procedure events: hypoxia     Intra-procedure management:  Supplemental oxygen   Total Provider sedation time (minutes):  30 Post-procedure details:    Post-sedation assessment completed:  03/22/2021 3:05 PM   Attendance: Constant attendance by certified staff until patient recovered     Post-sedation assessments completed and reviewed: airway patency, mental status, nausea/vomiting, pain level and respiratory function     Patient is stable for discharge or admission: yes     Procedure completion:  Tolerated well, no immediate complications  Reduction of dislocation Date/Time: 3:26 PM Performed by: Harvest Dark Authorized by: Harvest Dark Consent:  Verbal consent obtained. Risks and benefits: risks, benefits and alternatives were discussed Consent given by: patient Required items: required blood products, implants, devices, and special equipment available Time out: Immediately prior to procedure a "time out" was called to verify the correct patient, procedure, equipment, support staff and site/side marked as required.  Patient sedated: Yes  Vitals: Vital signs were monitored during sedation. Patient tolerance: Patient tolerated the procedure well with no immediate complications. Joint: Right shoulder Reduction technique: Traction, countertraction.   FINAL CLINICAL IMPRESSION(S) / ED DIAGNOSES   Right shoulder dislocation   Note:  This document was prepared using Dragon voice recognition software and may include unintentional dictation errors.   Harvest Dark, MD 03/22/21 1527

## 2021-03-23 ENCOUNTER — Encounter: Payer: Self-pay | Admitting: Orthopedic Surgery

## 2021-03-23 NOTE — Addendum Note (Signed)
Addendum  created 03/23/21 2042 by Iran Ouch, MD   Clinical Note Signed

## 2024-01-29 IMAGING — DX DG SHOULDER 2+V*R*
2 series · 2 of 2 positions shown · non-contrast
Comparison: Earlier today

CLINICAL DATA: Evaluate shoulder reduction.

EXAM:
RIGHT SHOULDER - 2+ VIEW

[shoulder ap]
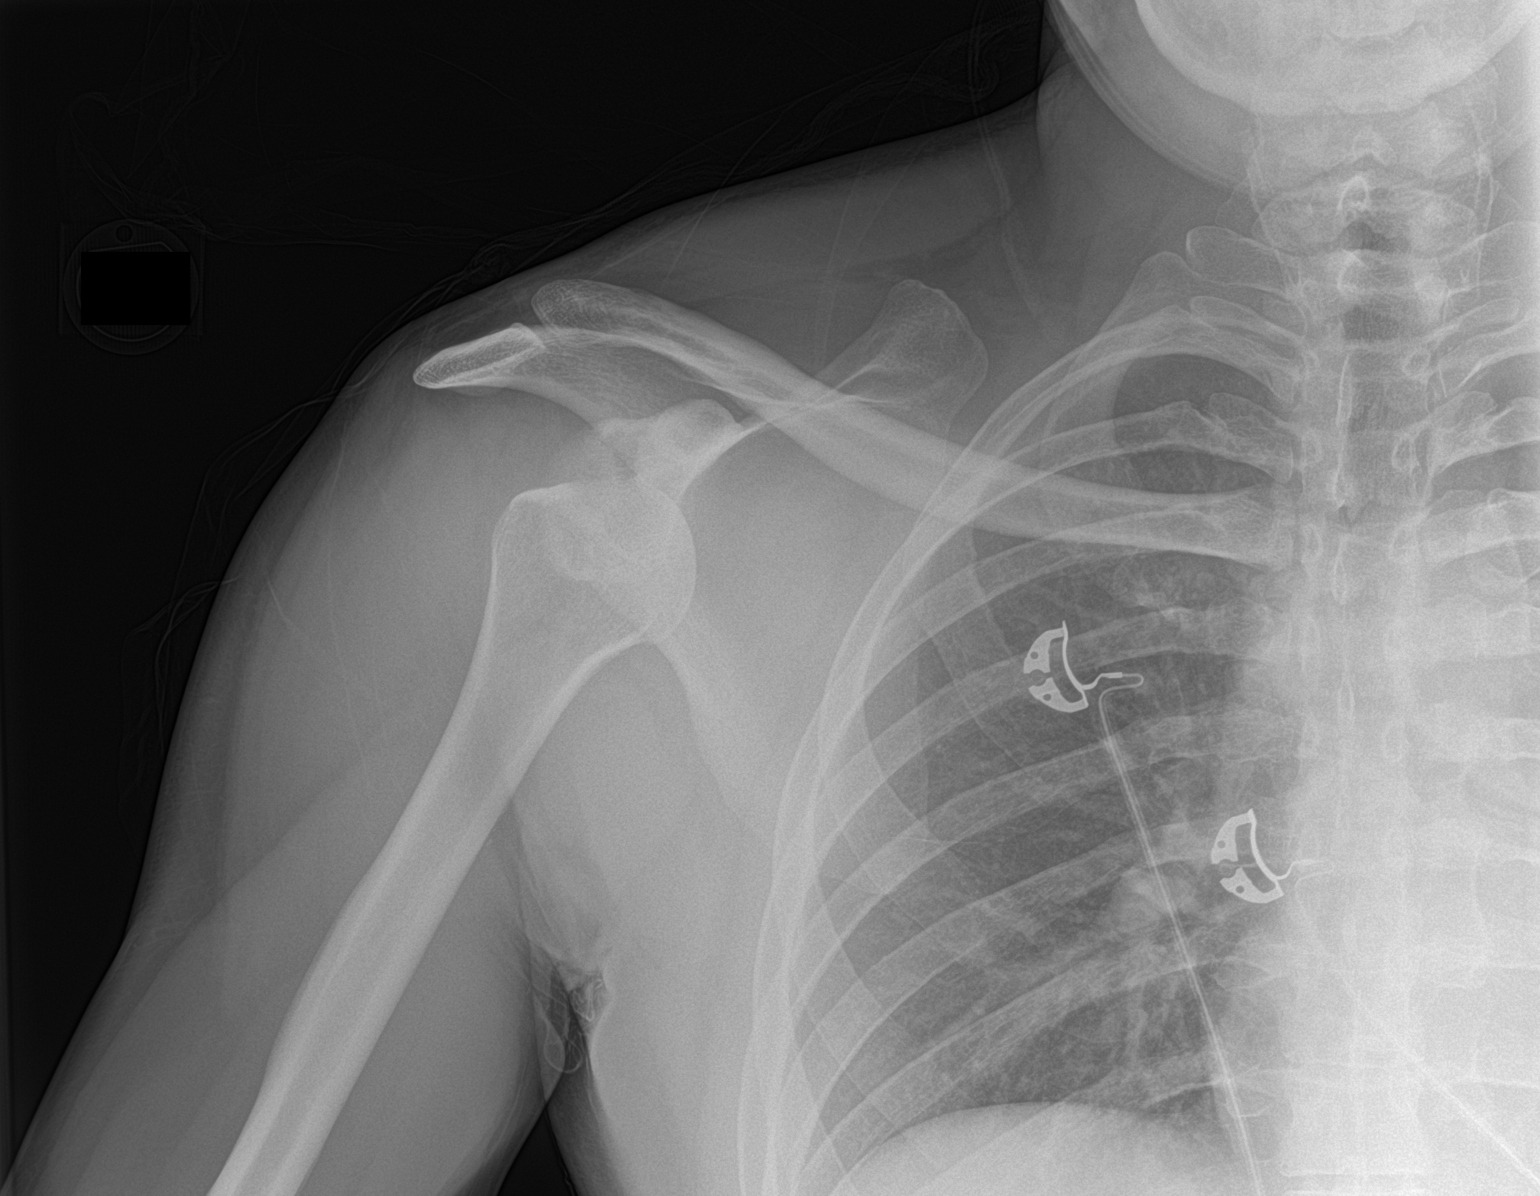

[shoulder obl]
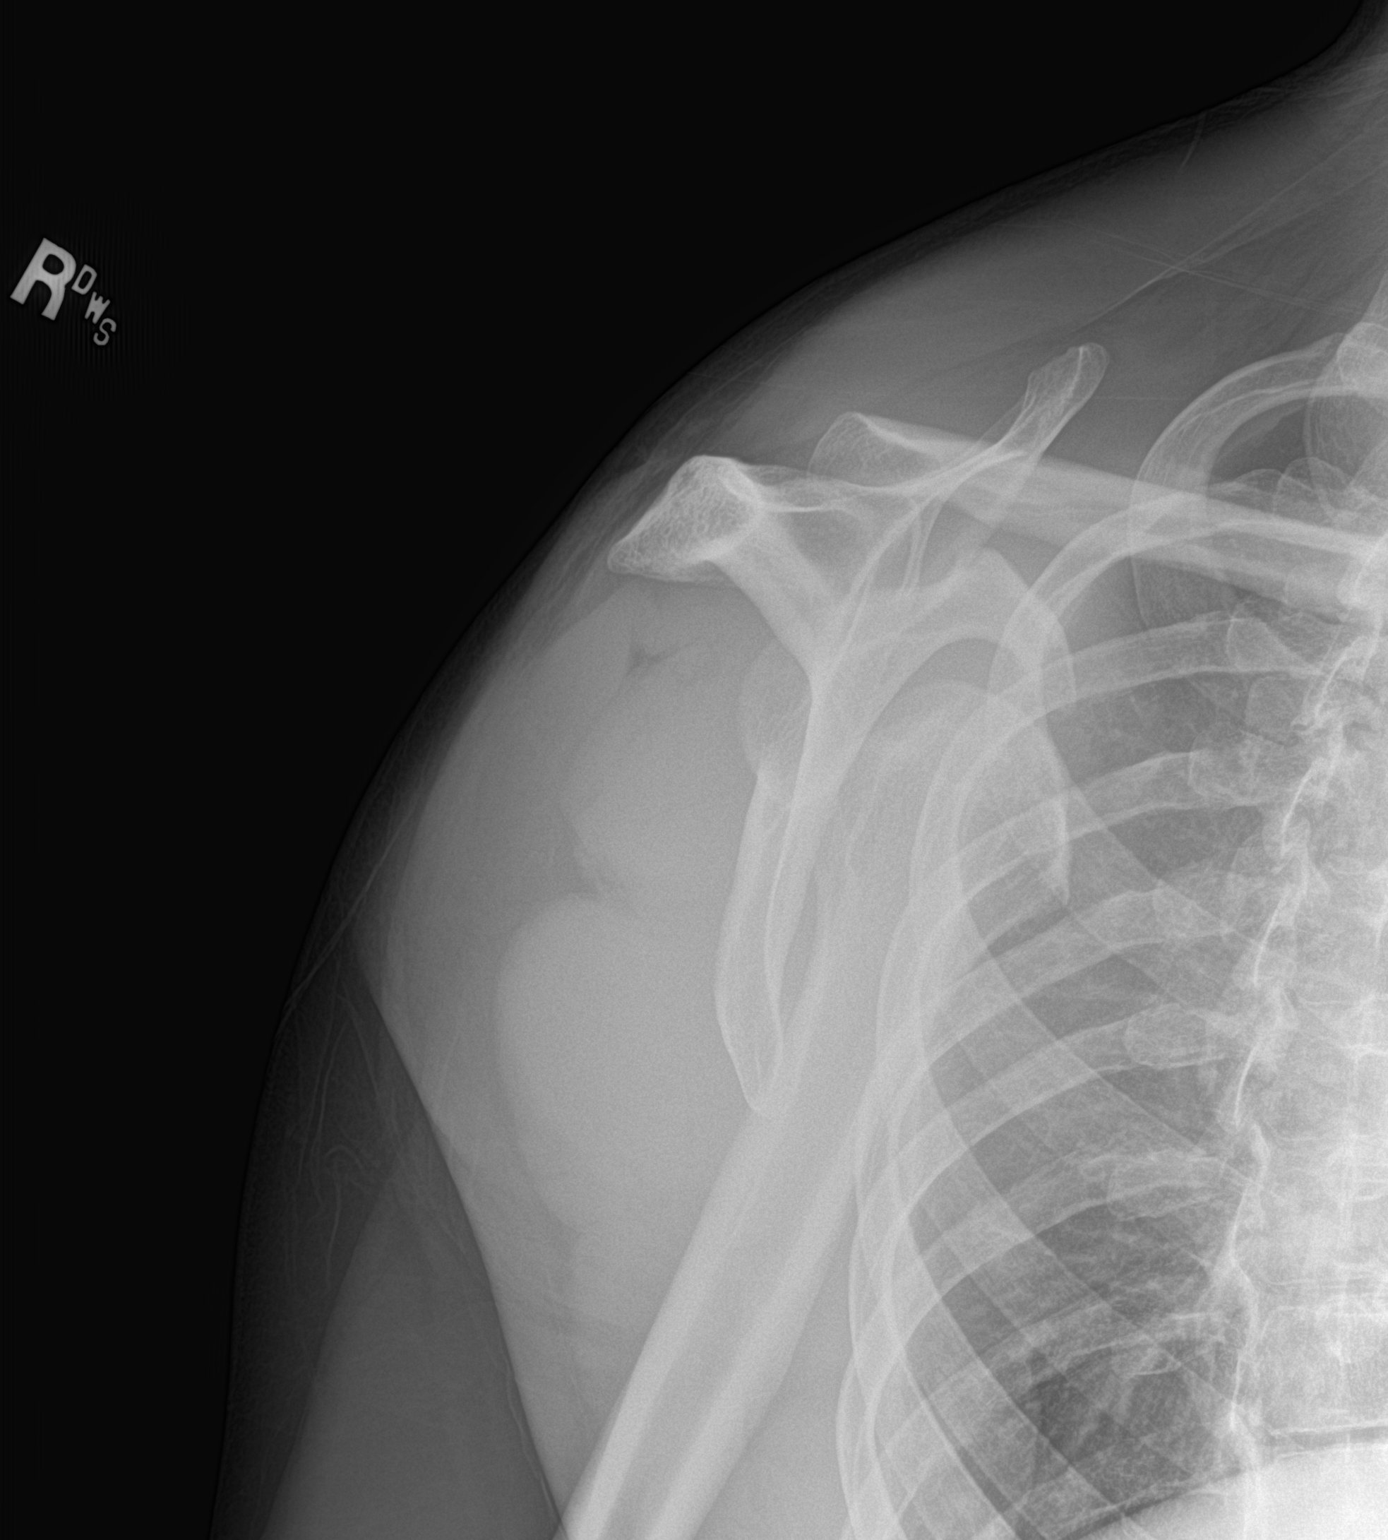

[2 of 2 positions shown; findings below may reference images not displayed]

FINDINGS: There is continued anterior and inferior shoulder dislocation. Known
Hill-Sachs deformity is again noted. No new findings.
IMPRESSION: Persistent anterior and inferior shoulder dislocation.
